# Patient Record
Sex: Female | Born: 1980 | Race: White | Hispanic: No | Marital: Single | Smoking: Never smoker
Health system: Southern US, Community
[De-identification: ages and names within clinical notes are randomized; demographics above are authoritative.]

## PROBLEM LIST (undated history)

## (undated) DIAGNOSIS — F4310 Post-traumatic stress disorder, unspecified: Secondary | ICD-10-CM

## (undated) DIAGNOSIS — M069 Rheumatoid arthritis, unspecified: Secondary | ICD-10-CM

## (undated) DIAGNOSIS — F329 Major depressive disorder, single episode, unspecified: Secondary | ICD-10-CM

## (undated) DIAGNOSIS — T148XXA Other injury of unspecified body region, initial encounter: Secondary | ICD-10-CM

## (undated) DIAGNOSIS — T8859XA Other complications of anesthesia, initial encounter: Secondary | ICD-10-CM

## (undated) DIAGNOSIS — R569 Unspecified convulsions: Secondary | ICD-10-CM

## (undated) DIAGNOSIS — K429 Umbilical hernia without obstruction or gangrene: Secondary | ICD-10-CM

## (undated) DIAGNOSIS — G629 Polyneuropathy, unspecified: Secondary | ICD-10-CM

## (undated) DIAGNOSIS — M797 Fibromyalgia: Secondary | ICD-10-CM

## (undated) DIAGNOSIS — F32A Depression, unspecified: Secondary | ICD-10-CM

## (undated) DIAGNOSIS — G4730 Sleep apnea, unspecified: Secondary | ICD-10-CM

## (undated) DIAGNOSIS — I10 Essential (primary) hypertension: Secondary | ICD-10-CM

## (undated) DIAGNOSIS — K5792 Diverticulitis of intestine, part unspecified, without perforation or abscess without bleeding: Secondary | ICD-10-CM

## (undated) HISTORY — DX: Rheumatoid arthritis, unspecified: M06.9

## (undated) HISTORY — PX: NERVE BIOPSY: SHX1015

## (undated) HISTORY — PX: FOOT TENDON SURGERY: SHX958

## (undated) HISTORY — PX: CHOLECYSTECTOMY: SHX55

## (undated) HISTORY — PX: COLON SURGERY: SHX602

## (undated) HISTORY — PX: SACRAL NERVE STIMULATOR PLACEMENT: SHX2367

## (undated) HISTORY — PX: APPENDECTOMY: SHX54

## (undated) HISTORY — PX: TONSILLECTOMY: SUR1361

---
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
## 1998-06-25 ENCOUNTER — Encounter: Payer: Self-pay | Admitting: Emergency Medicine

## 1998-06-25 ENCOUNTER — Emergency Department (HOSPITAL_COMMUNITY): Admission: EM | Admit: 1998-06-25 | Discharge: 1998-06-25 | Payer: Self-pay | Admitting: Emergency Medicine

## 1999-04-29 ENCOUNTER — Ambulatory Visit (HOSPITAL_BASED_OUTPATIENT_CLINIC_OR_DEPARTMENT_OTHER): Admission: RE | Admit: 1999-04-29 | Discharge: 1999-04-29 | Payer: Self-pay | Admitting: *Deleted

## 1999-07-14 ENCOUNTER — Encounter: Payer: Self-pay | Admitting: Emergency Medicine

## 1999-07-14 ENCOUNTER — Emergency Department (HOSPITAL_COMMUNITY): Admission: EM | Admit: 1999-07-14 | Discharge: 1999-07-14 | Payer: Self-pay | Admitting: Emergency Medicine

## 1999-09-23 ENCOUNTER — Ambulatory Visit (HOSPITAL_COMMUNITY): Admission: RE | Admit: 1999-09-23 | Discharge: 1999-09-23 | Payer: Self-pay | Admitting: Orthopedic Surgery

## 2000-04-06 ENCOUNTER — Encounter: Admission: RE | Admit: 2000-04-06 | Discharge: 2000-04-06 | Payer: Self-pay | Admitting: Family Medicine

## 2000-04-06 ENCOUNTER — Encounter: Payer: Self-pay | Admitting: Family Medicine

## 2000-07-24 ENCOUNTER — Ambulatory Visit (HOSPITAL_BASED_OUTPATIENT_CLINIC_OR_DEPARTMENT_OTHER): Admission: RE | Admit: 2000-07-24 | Discharge: 2000-07-24 | Payer: Self-pay | Admitting: Orthopedic Surgery

## 2002-01-11 ENCOUNTER — Ambulatory Visit (HOSPITAL_COMMUNITY): Admission: RE | Admit: 2002-01-11 | Discharge: 2002-01-11 | Payer: Self-pay | Admitting: General Practice

## 2002-03-31 ENCOUNTER — Encounter: Payer: Self-pay | Admitting: Emergency Medicine

## 2002-03-31 ENCOUNTER — Emergency Department (HOSPITAL_COMMUNITY): Admission: EM | Admit: 2002-03-31 | Discharge: 2002-03-31 | Payer: Self-pay | Admitting: Emergency Medicine

## 2002-05-15 ENCOUNTER — Encounter: Payer: Self-pay | Admitting: *Deleted

## 2002-05-15 ENCOUNTER — Emergency Department (HOSPITAL_COMMUNITY): Admission: EM | Admit: 2002-05-15 | Discharge: 2002-05-15 | Payer: Self-pay | Admitting: Emergency Medicine

## 2002-07-10 ENCOUNTER — Encounter: Admission: RE | Admit: 2002-07-10 | Discharge: 2002-07-10 | Payer: Self-pay | Admitting: Family Medicine

## 2002-07-10 ENCOUNTER — Encounter: Payer: Self-pay | Admitting: Family Medicine

## 2002-12-24 ENCOUNTER — Encounter: Payer: Self-pay | Admitting: General Practice

## 2002-12-24 ENCOUNTER — Encounter: Admission: RE | Admit: 2002-12-24 | Discharge: 2002-12-24 | Payer: Self-pay | Admitting: General Practice

## 2003-06-05 ENCOUNTER — Ambulatory Visit (HOSPITAL_COMMUNITY): Admission: RE | Admit: 2003-06-05 | Discharge: 2003-06-05 | Payer: Self-pay | Admitting: Internal Medicine

## 2003-06-24 ENCOUNTER — Ambulatory Visit (HOSPITAL_COMMUNITY): Admission: RE | Admit: 2003-06-24 | Discharge: 2003-06-24 | Payer: Self-pay | Admitting: Internal Medicine

## 2004-08-09 ENCOUNTER — Emergency Department (HOSPITAL_COMMUNITY): Admission: EM | Admit: 2004-08-09 | Discharge: 2004-08-09 | Payer: Self-pay | Admitting: Emergency Medicine

## 2004-09-08 ENCOUNTER — Emergency Department (HOSPITAL_COMMUNITY): Admission: EM | Admit: 2004-09-08 | Discharge: 2004-09-08 | Payer: Self-pay | Admitting: Emergency Medicine

## 2004-09-10 ENCOUNTER — Emergency Department (HOSPITAL_COMMUNITY): Admission: EM | Admit: 2004-09-10 | Discharge: 2004-09-10 | Payer: Self-pay | Admitting: Emergency Medicine

## 2005-01-26 ENCOUNTER — Emergency Department (HOSPITAL_COMMUNITY): Admission: EM | Admit: 2005-01-26 | Discharge: 2005-01-26 | Payer: Self-pay | Admitting: Family Medicine

## 2005-01-26 ENCOUNTER — Ambulatory Visit (HOSPITAL_COMMUNITY): Admission: RE | Admit: 2005-01-26 | Discharge: 2005-01-26 | Payer: Self-pay | Admitting: Family Medicine

## 2005-02-11 ENCOUNTER — Ambulatory Visit (HOSPITAL_COMMUNITY): Admission: RE | Admit: 2005-02-11 | Discharge: 2005-02-11 | Payer: Self-pay | Admitting: Obstetrics and Gynecology

## 2005-03-10 ENCOUNTER — Emergency Department (HOSPITAL_COMMUNITY): Admission: EM | Admit: 2005-03-10 | Discharge: 2005-03-11 | Payer: Self-pay | Admitting: Emergency Medicine

## 2005-03-11 ENCOUNTER — Inpatient Hospital Stay (HOSPITAL_COMMUNITY): Admission: EM | Admit: 2005-03-11 | Discharge: 2005-03-12 | Payer: Self-pay | Admitting: Emergency Medicine

## 2005-03-11 ENCOUNTER — Encounter (INDEPENDENT_AMBULATORY_CARE_PROVIDER_SITE_OTHER): Payer: Self-pay | Admitting: *Deleted

## 2006-07-06 ENCOUNTER — Encounter: Admission: RE | Admit: 2006-07-06 | Discharge: 2006-07-06 | Payer: Self-pay | Admitting: Family Medicine

## 2007-02-17 ENCOUNTER — Emergency Department (HOSPITAL_COMMUNITY): Admission: EM | Admit: 2007-02-17 | Discharge: 2007-02-17 | Payer: Self-pay | Admitting: Emergency Medicine

## 2007-04-30 ENCOUNTER — Emergency Department (HOSPITAL_COMMUNITY): Admission: EM | Admit: 2007-04-30 | Discharge: 2007-04-30 | Payer: Self-pay | Admitting: Emergency Medicine

## 2007-05-03 ENCOUNTER — Emergency Department (HOSPITAL_COMMUNITY): Admission: EM | Admit: 2007-05-03 | Discharge: 2007-05-04 | Payer: Self-pay | Admitting: Emergency Medicine

## 2007-05-06 ENCOUNTER — Emergency Department (HOSPITAL_COMMUNITY): Admission: EM | Admit: 2007-05-06 | Discharge: 2007-05-06 | Payer: Self-pay | Admitting: Emergency Medicine

## 2007-07-16 ENCOUNTER — Emergency Department (HOSPITAL_COMMUNITY): Admission: EM | Admit: 2007-07-16 | Discharge: 2007-07-16 | Payer: Self-pay | Admitting: Emergency Medicine

## 2007-09-05 ENCOUNTER — Emergency Department (HOSPITAL_COMMUNITY): Admission: EM | Admit: 2007-09-05 | Discharge: 2007-09-06 | Payer: Self-pay | Admitting: Emergency Medicine

## 2007-09-23 ENCOUNTER — Inpatient Hospital Stay (HOSPITAL_COMMUNITY): Admission: AD | Admit: 2007-09-23 | Discharge: 2007-09-23 | Payer: Self-pay | Admitting: Obstetrics and Gynecology

## 2007-11-30 ENCOUNTER — Inpatient Hospital Stay (HOSPITAL_COMMUNITY): Admission: AD | Admit: 2007-11-30 | Discharge: 2007-12-01 | Payer: Self-pay | Admitting: Obstetrics and Gynecology

## 2008-07-23 ENCOUNTER — Emergency Department (HOSPITAL_BASED_OUTPATIENT_CLINIC_OR_DEPARTMENT_OTHER): Admission: EM | Admit: 2008-07-23 | Discharge: 2008-07-23 | Payer: Self-pay | Admitting: Emergency Medicine

## 2008-09-11 ENCOUNTER — Emergency Department (HOSPITAL_BASED_OUTPATIENT_CLINIC_OR_DEPARTMENT_OTHER): Admission: EM | Admit: 2008-09-11 | Discharge: 2008-09-11 | Payer: Self-pay | Admitting: Emergency Medicine

## 2008-10-23 ENCOUNTER — Ambulatory Visit (HOSPITAL_COMMUNITY): Admission: RE | Admit: 2008-10-23 | Discharge: 2008-10-23 | Payer: Self-pay | Admitting: Obstetrics and Gynecology

## 2008-10-23 ENCOUNTER — Encounter (INDEPENDENT_AMBULATORY_CARE_PROVIDER_SITE_OTHER): Payer: Self-pay | Admitting: Obstetrics and Gynecology

## 2009-03-04 ENCOUNTER — Ambulatory Visit: Payer: Self-pay | Admitting: Diagnostic Radiology

## 2009-03-04 ENCOUNTER — Emergency Department (HOSPITAL_BASED_OUTPATIENT_CLINIC_OR_DEPARTMENT_OTHER): Admission: EM | Admit: 2009-03-04 | Discharge: 2009-03-04 | Payer: Self-pay | Admitting: Emergency Medicine

## 2009-05-11 ENCOUNTER — Inpatient Hospital Stay (HOSPITAL_COMMUNITY): Admission: AD | Admit: 2009-05-11 | Discharge: 2009-05-11 | Payer: Self-pay | Admitting: Obstetrics and Gynecology

## 2009-06-22 ENCOUNTER — Emergency Department (HOSPITAL_BASED_OUTPATIENT_CLINIC_OR_DEPARTMENT_OTHER): Admission: EM | Admit: 2009-06-22 | Discharge: 2009-06-22 | Payer: Self-pay | Admitting: Emergency Medicine

## 2009-06-22 ENCOUNTER — Ambulatory Visit: Payer: Self-pay | Admitting: Diagnostic Radiology

## 2009-07-03 ENCOUNTER — Ambulatory Visit: Payer: Self-pay | Admitting: Diagnostic Radiology

## 2009-07-03 ENCOUNTER — Emergency Department (HOSPITAL_BASED_OUTPATIENT_CLINIC_OR_DEPARTMENT_OTHER): Admission: EM | Admit: 2009-07-03 | Discharge: 2009-07-03 | Payer: Self-pay | Admitting: Emergency Medicine

## 2009-07-08 ENCOUNTER — Inpatient Hospital Stay (HOSPITAL_COMMUNITY): Admission: EM | Admit: 2009-07-08 | Discharge: 2009-07-10 | Payer: Self-pay | Admitting: Emergency Medicine

## 2009-07-26 ENCOUNTER — Emergency Department (HOSPITAL_BASED_OUTPATIENT_CLINIC_OR_DEPARTMENT_OTHER): Admission: EM | Admit: 2009-07-26 | Discharge: 2009-07-26 | Payer: Self-pay | Admitting: Emergency Medicine

## 2009-07-26 ENCOUNTER — Ambulatory Visit: Payer: Self-pay | Admitting: Diagnostic Radiology

## 2009-10-08 ENCOUNTER — Emergency Department (HOSPITAL_BASED_OUTPATIENT_CLINIC_OR_DEPARTMENT_OTHER): Admission: EM | Admit: 2009-10-08 | Discharge: 2009-10-08 | Payer: Self-pay | Admitting: Emergency Medicine

## 2009-11-17 ENCOUNTER — Emergency Department (HOSPITAL_BASED_OUTPATIENT_CLINIC_OR_DEPARTMENT_OTHER): Admission: EM | Admit: 2009-11-17 | Discharge: 2009-11-17 | Payer: Self-pay | Admitting: Emergency Medicine

## 2009-11-19 ENCOUNTER — Ambulatory Visit: Payer: Self-pay | Admitting: Interventional Radiology

## 2009-11-19 ENCOUNTER — Emergency Department (HOSPITAL_BASED_OUTPATIENT_CLINIC_OR_DEPARTMENT_OTHER): Admission: EM | Admit: 2009-11-19 | Discharge: 2009-11-20 | Payer: Self-pay | Admitting: Emergency Medicine

## 2009-11-23 ENCOUNTER — Emergency Department (HOSPITAL_BASED_OUTPATIENT_CLINIC_OR_DEPARTMENT_OTHER): Admission: EM | Admit: 2009-11-23 | Discharge: 2009-11-23 | Payer: Self-pay | Admitting: Emergency Medicine

## 2009-12-03 ENCOUNTER — Emergency Department (HOSPITAL_BASED_OUTPATIENT_CLINIC_OR_DEPARTMENT_OTHER): Admission: EM | Admit: 2009-12-03 | Discharge: 2009-12-03 | Payer: Self-pay | Admitting: Emergency Medicine

## 2009-12-06 ENCOUNTER — Emergency Department (HOSPITAL_BASED_OUTPATIENT_CLINIC_OR_DEPARTMENT_OTHER): Admission: EM | Admit: 2009-12-06 | Discharge: 2009-12-06 | Payer: Self-pay | Admitting: Emergency Medicine

## 2009-12-06 ENCOUNTER — Ambulatory Visit: Payer: Self-pay | Admitting: Diagnostic Radiology

## 2009-12-17 ENCOUNTER — Ambulatory Visit: Payer: Self-pay | Admitting: Obstetrics and Gynecology

## 2009-12-17 ENCOUNTER — Inpatient Hospital Stay (HOSPITAL_COMMUNITY): Admission: AD | Admit: 2009-12-17 | Discharge: 2009-12-17 | Payer: Self-pay | Admitting: Obstetrics and Gynecology

## 2010-01-12 ENCOUNTER — Emergency Department (HOSPITAL_BASED_OUTPATIENT_CLINIC_OR_DEPARTMENT_OTHER): Admission: EM | Admit: 2010-01-12 | Discharge: 2010-01-12 | Payer: Self-pay | Admitting: Emergency Medicine

## 2010-03-13 ENCOUNTER — Emergency Department (HOSPITAL_BASED_OUTPATIENT_CLINIC_OR_DEPARTMENT_OTHER): Admission: EM | Admit: 2010-03-13 | Discharge: 2010-03-13 | Payer: Self-pay | Admitting: Emergency Medicine

## 2010-03-13 ENCOUNTER — Ambulatory Visit: Payer: Self-pay | Admitting: Diagnostic Radiology

## 2010-03-25 IMAGING — CR DG CHEST 2V
2 series · 2 of 2 positions shown · non-contrast
Comparison: 07/16/2007

CLINICAL DATA: Shortness of breath

CHEST - 2 VIEW

[w chest pa]
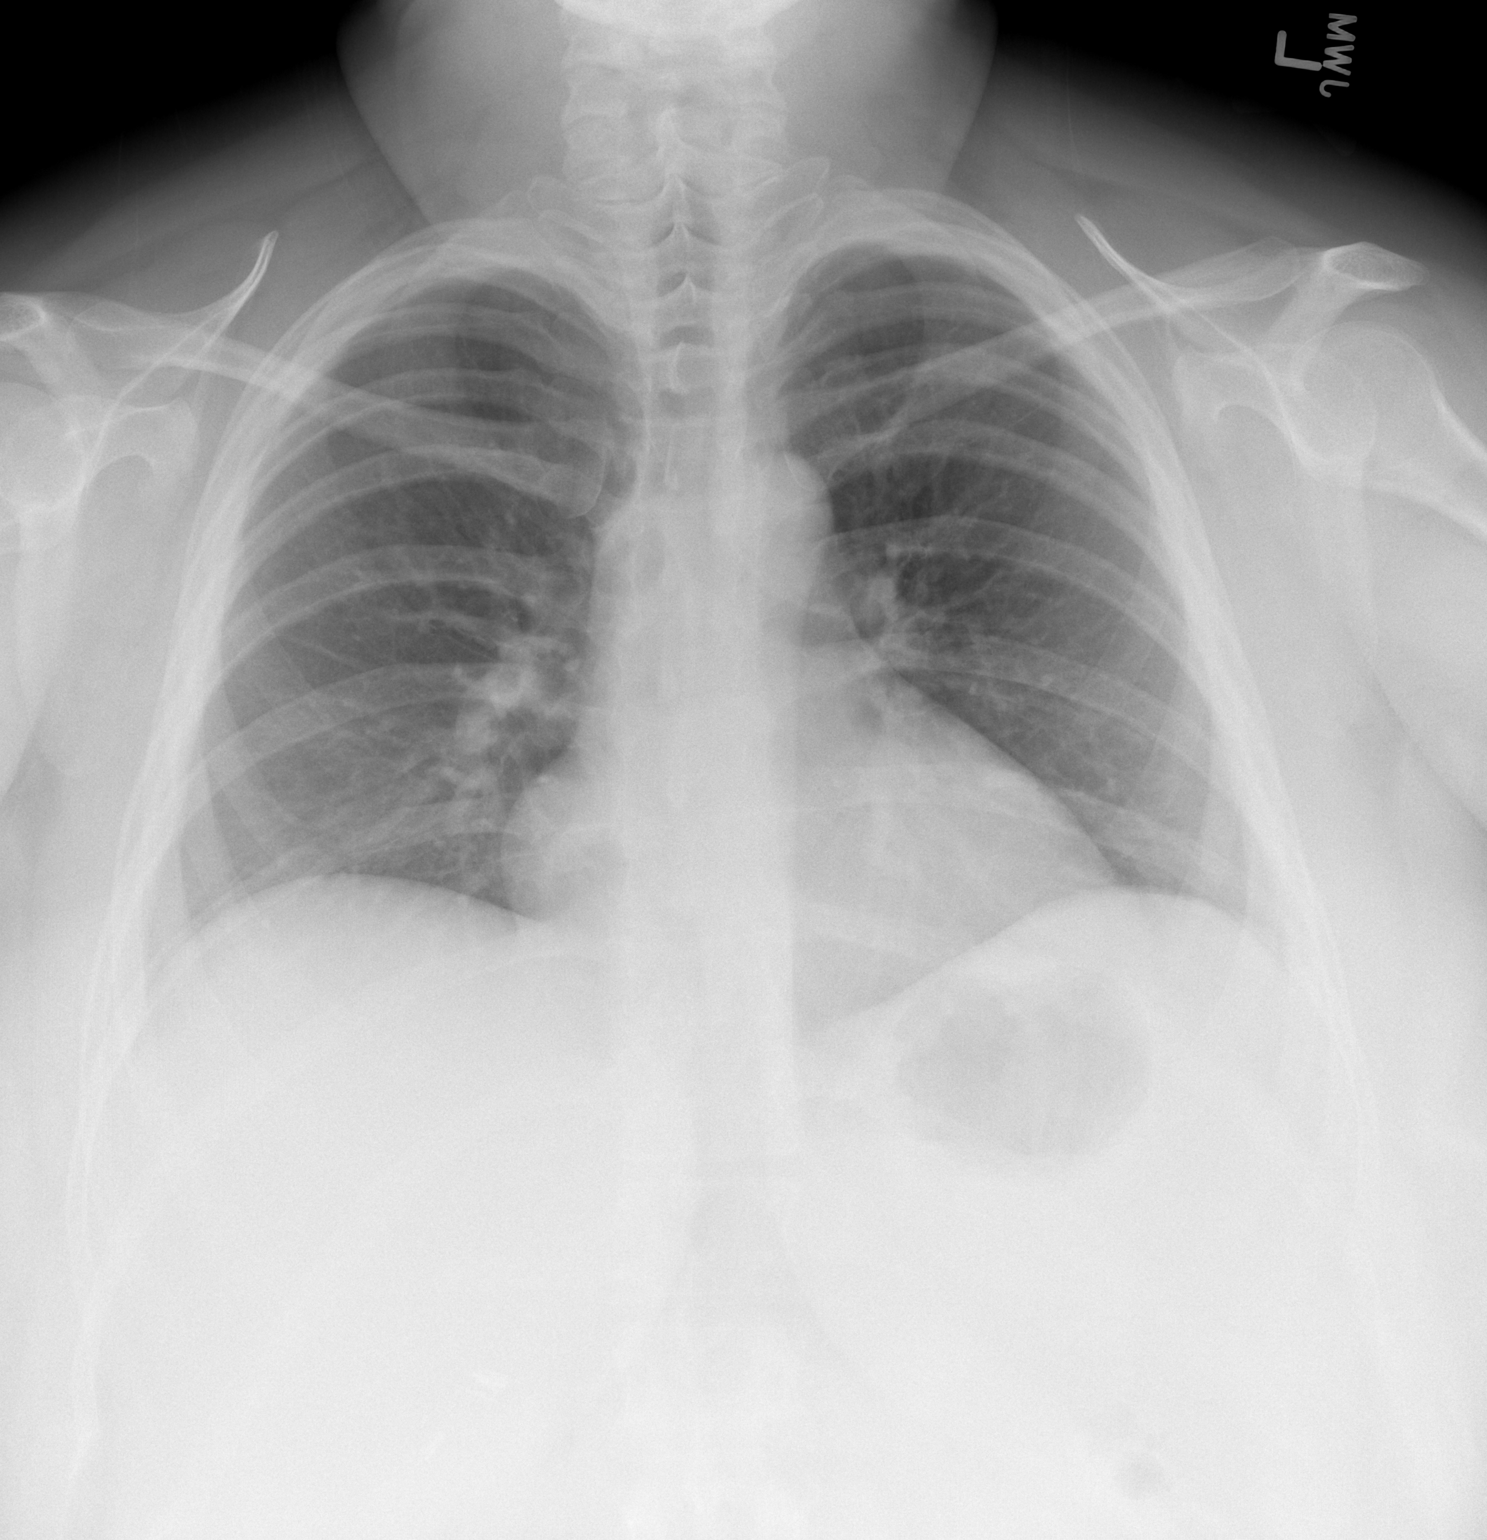

[w chest lat]
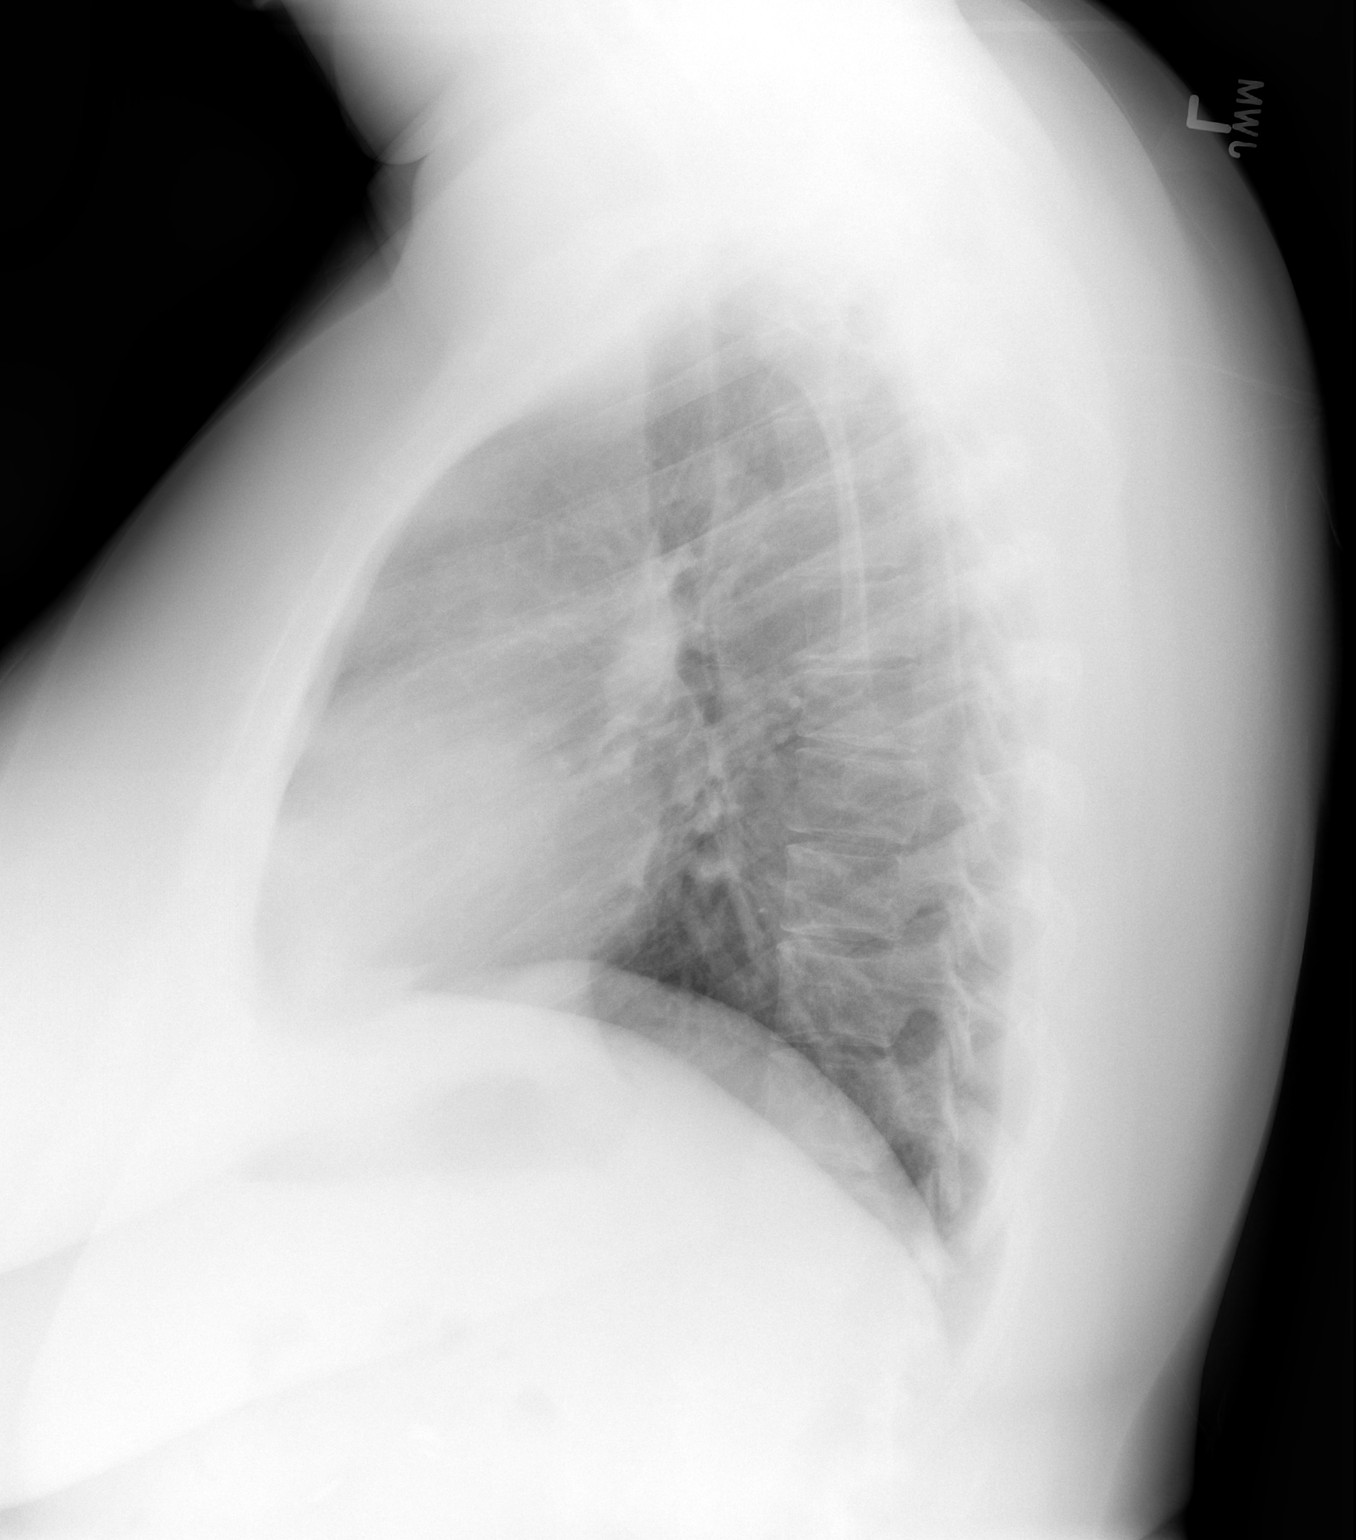

[2 of 2 positions shown; findings below may reference images not displayed]

FINDINGS: Cardiomediastinal silhouette is stable.  No acute
infiltrate or edema.  Minimal decreased height of  probable T7
vertebral body is stable from prior exam.  No pleural effusion.
IMPRESSION: No active disease.  No significant change.

## 2010-05-08 ENCOUNTER — Encounter: Payer: Self-pay | Admitting: Internal Medicine

## 2010-06-29 LAB — PREGNANCY, URINE: Preg Test, Ur: NEGATIVE

## 2010-07-01 LAB — COMPREHENSIVE METABOLIC PANEL WITH GFR
ALT: 28 U/L (ref 0–35)
AST: 33 U/L (ref 0–37)
Albumin: 3.8 g/dL (ref 3.5–5.2)
Alkaline Phosphatase: 105 U/L (ref 39–117)
BUN: 12 mg/dL (ref 6–23)
CO2: 24 meq/L (ref 19–32)
Calcium: 9.2 mg/dL (ref 8.4–10.5)
Chloride: 109 meq/L (ref 96–112)
Creatinine, Ser: 0.8 mg/dL (ref 0.4–1.2)
GFR calc non Af Amer: >60 mL/min
Glucose, Bld: 89 mg/dL (ref 70–99)
Potassium: 4.5 meq/L (ref 3.5–5.1)
Sodium: 143 meq/L (ref 135–145)
Total Bilirubin: 0.6 mg/dL (ref 0.3–1.2)
Total Protein: 7.2 g/dL (ref 6.0–8.3)

## 2010-07-01 LAB — CBC
HCT: 42.8 % (ref 36.0–46.0)
HCT: 40.1 % (ref 36.0–46.0)
Hemoglobin: 13.6 g/dL (ref 12.0–15.0)
MCH: 29.7 pg (ref 26.0–34.0)
MCV: 89.7 fL (ref 78.0–100.0)
MCV: 90.8 fL (ref 78.0–100.0)
RBC: 4.77 MIL/uL (ref 3.87–5.11)
RDW: 12.4 % (ref 11.5–15.5)
RDW: 13.2 % (ref 11.5–15.5)
WBC: 8.9 10*3/uL (ref 4.0–10.5)
WBC: 9.2 10*3/uL (ref 4.0–10.5)

## 2010-07-01 LAB — DIFFERENTIAL
Lymphocytes Relative: 32 % (ref 12–46)
Lymphs Abs: 2.9 10*3/uL (ref 0.7–4.0)
Monocytes Absolute: 0.6 10*3/uL (ref 0.1–1.0)
Monocytes Relative: 7 % (ref 3–12)
Neutro Abs: 5.2 10*3/uL (ref 1.7–7.7)

## 2010-07-01 LAB — URINALYSIS, ROUTINE W REFLEX MICROSCOPIC
Bilirubin Urine: NEGATIVE
Hgb urine dipstick: NEGATIVE
Nitrite: POSITIVE — AB
Protein, ur: NEGATIVE mg/dL
Specific Gravity, Urine: 1.01 (ref 1.005–1.030)
Specific Gravity, Urine: 1.01 (ref 1.005–1.030)
Urobilinogen, UA: 0.2 mg/dL (ref 0.0–1.0)
Urobilinogen, UA: 0.2 mg/dL (ref 0.0–1.0)

## 2010-07-01 LAB — WET PREP, GENITAL
Clue Cells Wet Prep HPF POC: NONE SEEN
Trich, Wet Prep: NONE SEEN
Yeast Wet Prep HPF POC: NONE SEEN

## 2010-07-01 LAB — URINE MICROSCOPIC-ADD ON

## 2010-07-01 LAB — PROTIME-INR
INR: 1 (ref 0.00–1.49)
Prothrombin Time: 13.4 s (ref 11.6–15.2)

## 2010-07-01 LAB — HEMOCCULT GUIAC POC 1CARD (OFFICE): Fecal Occult Bld: POSITIVE

## 2010-07-01 LAB — PREGNANCY, URINE: Preg Test, Ur: NEGATIVE

## 2010-07-01 LAB — GC/CHLAMYDIA PROBE AMP, GENITAL
Chlamydia, DNA Probe: POSITIVE — AB
GC Probe Amp, Genital: NEGATIVE

## 2010-07-02 LAB — BASIC METABOLIC PANEL
CO2: 27 mEq/L (ref 19–32)
Chloride: 109 mEq/L (ref 96–112)
Creatinine, Ser: 0.9 mg/dL (ref 0.4–1.2)
GFR calc Af Amer: >60 mL/min (ref >60)
Potassium: 3.8 mEq/L (ref 3.5–5.1)

## 2010-07-02 LAB — CBC
HCT: 40.2 % (ref 36.0–46.0)
Hemoglobin: 13.6 g/dL (ref 12.0–15.0)
MCH: 30.4 pg (ref 26.0–34.0)
MCV: 89.9 fL (ref 78.0–100.0)
Platelets: 263 10*3/uL (ref 150–400)
RBC: 4.47 MIL/uL (ref 3.87–5.11)

## 2010-07-02 LAB — CSF CULTURE W GRAM STAIN: Culture: NO GROWTH

## 2010-07-02 LAB — PROTEIN AND GLUCOSE, CSF
Glucose, CSF: 49 mg/dL (ref 43–76)
Total  Protein, CSF: 30 mg/dL (ref 15–45)

## 2010-07-02 LAB — CSF CELL COUNT WITH DIFFERENTIAL
Tube #: 1
Tube #: 4

## 2010-07-02 LAB — GRAM STAIN

## 2010-07-04 LAB — URINALYSIS, ROUTINE W REFLEX MICROSCOPIC
Glucose, UA: NEGATIVE mg/dL
Glucose, UA: NEGATIVE mg/dL
Hgb urine dipstick: NEGATIVE
Leukocytes, UA: NEGATIVE
Protein, ur: NEGATIVE mg/dL
Protein, ur: NEGATIVE mg/dL
Specific Gravity, Urine: 1.004 — ABNORMAL LOW (ref 1.005–1.030)

## 2010-07-04 LAB — URINE MICROSCOPIC-ADD ON

## 2010-07-07 LAB — BASIC METABOLIC PANEL WITH GFR
BUN: 8 mg/dL (ref 6–23)
CO2: 26 meq/L (ref 19–32)
Calcium: 8.9 mg/dL (ref 8.4–10.5)
Chloride: 110 meq/L (ref 96–112)
Creatinine, Ser: 0.8 mg/dL (ref 0.4–1.2)
GFR calc non Af Amer: >60 mL/min
Glucose, Bld: 75 mg/dL (ref 70–99)
Potassium: 3.9 meq/L (ref 3.5–5.1)
Sodium: 143 meq/L (ref 135–145)

## 2010-07-07 LAB — DIFFERENTIAL
Basophils Absolute: 0.3 K/uL — ABNORMAL HIGH (ref 0.0–0.1)
Basophils Relative: 3 % — ABNORMAL HIGH (ref 0–1)
Eosinophils Absolute: 0.2 K/uL (ref 0.0–0.7)
Eosinophils Relative: 2 % (ref 0–5)
Lymphocytes Relative: 29 % (ref 12–46)
Lymphs Abs: 2.9 K/uL (ref 0.7–4.0)
Monocytes Absolute: 0.7 K/uL (ref 0.1–1.0)
Monocytes Relative: 7 % (ref 3–12)
Neutro Abs: 6.2 K/uL (ref 1.7–7.7)
Neutrophils Relative %: 61 % (ref 43–77)

## 2010-07-07 LAB — URINALYSIS, ROUTINE W REFLEX MICROSCOPIC
Nitrite: NEGATIVE
Specific Gravity, Urine: 1.012 (ref 1.005–1.030)
pH: 5.5 (ref 5.0–8.0)

## 2010-07-07 LAB — CBC
HCT: 40.5 % (ref 36.0–46.0)
Hemoglobin: 13.5 g/dL (ref 12.0–15.0)
MCHC: 33.3 g/dL (ref 30.0–36.0)
MCV: 89.7 fL (ref 78.0–100.0)
Platelets: 272 K/uL (ref 150–400)
RBC: 4.51 MIL/uL (ref 3.87–5.11)
RDW: 12.9 % (ref 11.5–15.5)
WBC: 10.3 K/uL (ref 4.0–10.5)

## 2010-07-07 LAB — PREGNANCY, URINE: Preg Test, Ur: NEGATIVE

## 2010-07-09 LAB — BASIC METABOLIC PANEL
BUN: 9 mg/dL (ref 6–23)
Creatinine, Ser: 0.78 mg/dL (ref 0.4–1.2)
GFR calc non Af Amer: >60 mL/min (ref >60)
Glucose, Bld: 80 mg/dL (ref 70–99)

## 2010-07-09 LAB — POCT I-STAT, CHEM 8
BUN: 9 mg/dL (ref 6–23)
Chloride: 109 mEq/L (ref 96–112)
Potassium: 4 mEq/L (ref 3.5–5.1)
Sodium: 141 mEq/L (ref 135–145)

## 2010-07-09 LAB — MAGNESIUM: Magnesium: 2.1 mg/dL (ref 1.5–2.5)

## 2010-07-09 LAB — URINALYSIS, ROUTINE W REFLEX MICROSCOPIC
Glucose, UA: NEGATIVE mg/dL
Hgb urine dipstick: NEGATIVE
Protein, ur: NEGATIVE mg/dL
pH: 6 (ref 5.0–8.0)

## 2010-07-09 LAB — CBC
Hemoglobin: 14.5 g/dL (ref 12.0–15.0)
MCHC: 33.3 g/dL (ref 30.0–36.0)
MCV: 90.2 fL (ref 78.0–100.0)
RBC: 4.84 MIL/uL (ref 3.87–5.11)

## 2010-07-09 LAB — POCT CARDIAC MARKERS
CKMB, poc: <1 ng/mL — ABNORMAL LOW (ref 1.0–8.0)
Myoglobin, poc: 50.6 ng/mL (ref 12–200)
Troponin i, poc: <0.05 ng/mL (ref 0.00–0.09)

## 2010-07-09 LAB — TRICYCLICS SCREEN, URINE: TCA Scrn: NOT DETECTED

## 2010-07-09 LAB — POCT PREGNANCY, URINE: Preg Test, Ur: NEGATIVE

## 2010-07-09 LAB — DIFFERENTIAL
Basophils Relative: 0 % (ref 0–1)
Monocytes Absolute: 0.6 10*3/uL (ref 0.1–1.0)
Monocytes Relative: 5 % (ref 3–12)
Neutro Abs: 9.1 10*3/uL — ABNORMAL HIGH (ref 1.7–7.7)

## 2010-07-21 LAB — DIFFERENTIAL
Eosinophils Absolute: 0.2 10*3/uL (ref 0.0–0.7)
Eosinophils Relative: 2 % (ref 0–5)
Lymphocytes Relative: 26 % (ref 12–46)
Lymphs Abs: 2.1 10*3/uL (ref 0.7–4.0)
Monocytes Absolute: 0.6 10*3/uL (ref 0.1–1.0)

## 2010-07-21 LAB — HEMOCCULT GUIAC POC 1CARD (OFFICE): Fecal Occult Bld: POSITIVE

## 2010-07-21 LAB — URINALYSIS, ROUTINE W REFLEX MICROSCOPIC
Bilirubin Urine: NEGATIVE
Glucose, UA: NEGATIVE mg/dL
Ketones, ur: NEGATIVE mg/dL
Specific Gravity, Urine: 1.004 — ABNORMAL LOW (ref 1.005–1.030)
pH: 6 (ref 5.0–8.0)

## 2010-07-21 LAB — WET PREP, GENITAL
Trich, Wet Prep: NONE SEEN
WBC, Wet Prep HPF POC: NONE SEEN
Yeast Wet Prep HPF POC: NONE SEEN

## 2010-07-21 LAB — CBC
HCT: 40.4 % (ref 36.0–46.0)
Hemoglobin: 13.7 g/dL (ref 12.0–15.0)
MCV: 89.3 fL (ref 78.0–100.0)
Platelets: 240 10*3/uL (ref 150–400)
WBC: 8.1 10*3/uL (ref 4.0–10.5)

## 2010-07-21 LAB — BASIC METABOLIC PANEL
BUN: 8 mg/dL (ref 6–23)
Chloride: 109 mEq/L (ref 96–112)
GFR calc non Af Amer: >60 mL/min (ref >60)
Glucose, Bld: 88 mg/dL (ref 70–99)
Potassium: 4.2 mEq/L (ref 3.5–5.1)
Sodium: 146 mEq/L — ABNORMAL HIGH (ref 135–145)

## 2010-07-21 LAB — RPR: RPR Ser Ql: NONREACTIVE

## 2010-07-21 LAB — LIPASE, BLOOD: Lipase: 160 U/L (ref 23–300)

## 2010-07-25 LAB — CBC
MCHC: 34.7 g/dL (ref 30.0–36.0)
Platelets: 305 10*3/uL (ref 150–400)
RDW: 13.4 % (ref 11.5–15.5)

## 2010-07-25 LAB — PROTIME-INR
INR: 1 (ref 0.00–1.49)
Prothrombin Time: 13.5 seconds (ref 11.6–15.2)

## 2010-07-25 LAB — APTT: aPTT: 31 seconds (ref 24–37)

## 2010-07-27 LAB — BASIC METABOLIC PANEL
CO2: 28 mEq/L (ref 19–32)
Chloride: 107 mEq/L (ref 96–112)
GFR calc Af Amer: >60 mL/min (ref >60)
Potassium: 3.9 mEq/L (ref 3.5–5.1)
Sodium: 144 mEq/L (ref 135–145)

## 2010-07-27 LAB — GC/CHLAMYDIA PROBE AMP, GENITAL: GC Probe Amp, Genital: NEGATIVE

## 2010-07-27 LAB — URINALYSIS, ROUTINE W REFLEX MICROSCOPIC
Glucose, UA: NEGATIVE mg/dL
Protein, ur: NEGATIVE mg/dL

## 2010-07-27 LAB — URINE MICROSCOPIC-ADD ON

## 2010-07-27 LAB — DIFFERENTIAL
Basophils Relative: 0 % (ref 0–1)
Eosinophils Absolute: 0.3 10*3/uL (ref 0.0–0.7)
Lymphs Abs: 2.6 10*3/uL (ref 0.7–4.0)
Monocytes Absolute: 0.5 10*3/uL (ref 0.1–1.0)
Monocytes Relative: 6 % (ref 3–12)

## 2010-07-27 LAB — CBC
Hemoglobin: 13.5 g/dL (ref 12.0–15.0)
MCHC: 33.6 g/dL (ref 30.0–36.0)
MCV: 87.3 fL (ref 78.0–100.0)
RBC: 4.61 MIL/uL (ref 3.87–5.11)

## 2010-08-21 ENCOUNTER — Other Ambulatory Visit: Payer: Self-pay | Admitting: Obstetrics and Gynecology

## 2010-08-21 ENCOUNTER — Ambulatory Visit (HOSPITAL_COMMUNITY)
Admission: AD | Admit: 2010-08-21 | Discharge: 2010-08-21 | Disposition: A | Discharge status: Home / Self Care | Payer: Self-pay | Source: Ambulatory Visit | Attending: Obstetrics and Gynecology | Admitting: Obstetrics and Gynecology

## 2010-08-21 DIAGNOSIS — N920 Excessive and frequent menstruation with regular cycle: Secondary | ICD-10-CM | POA: Insufficient documentation

## 2010-08-21 LAB — CBC
HCT: 37.6 % (ref 36.0–46.0)
MCH: 28.7 pg (ref 26.0–34.0)
MCV: 87.6 fL (ref 78.0–100.0)
Platelets: 279 10*3/uL (ref 150–400)
RBC: 4.29 MIL/uL (ref 3.87–5.11)
RDW: 13.1 % (ref 11.5–15.5)

## 2010-08-23 NOTE — Op Note (Signed)
[name], Foster               ACCOUNT NO.:  [id number]  MEDICAL RECORD NO.:  [id number]           PATIENT TYPE:  O  LOCATION:  [facility]                          [facility]ACILITY[facility]  PHYSICIAN:  Miguel Aschoff, M.D.       DATE OF BIRTH:  02-05-1981  DATE OF PROCEDURE:  08/21/2010 DATE OF DISCHARGE:                              OPERATIVE REPORT   PREOPERATIVE DIAGNOSIS:  Menometrorrhagia.  POSTOPERATIVE DIAGNOSIS:  Menometrorrhagia.  PROCEDURE:  D and C.  SURGEON:  Miguel Aschoff, MD  ANESTHESIA:  IV sedation with paracervical block.  COMPLICATIONS:  None.  JUSTIFICATION:  The patient is a 30 year old white female whose has had 2 weeks of heavy vaginal bleeding.  The patient was seen early in the week and treated with Lysteda 650 mg two p.o. t.i.d. in an effort to control her bleeding as well as oral contraceptives.  However, she called in the morning of Aug 21, 2010, reporting that there was no improvement in the bleeding and her bleeding was getting heavier and she was feeling weaker because of it.  Because of the unresponsive bleeding to medical therapy, she presents now to undergo D and C in an effort to stop her bleeding and assess an etiology.  Risks and benefits of the procedure were discussed with the patient.  PROCEDURE:  The patient was taken to the operating room and was placed in the supine position.  An IV sedation was administered without difficulty.  She was then placed in dorsal lithotomy position, prepped and draped in the usual sterile fashion.  The bladder was catheterized. Examination under anesthesia revealed normal external genitalia, normal Bartholin and Skene glands, normal urethra.  The vaginal vault was without gross lesion.  There were clots noted within the vaginal vault as well as blood in the perineum.  Cervix did not have a gross lesions. Blood was coming through the cervical os along with small clots.  The uterus appeared to be top normal size.  No  adnexal masses were noted. At this point, a speculum was placed in the vaginal vault.  Anterior cervical lip was grasped with a tenaculum and then injected with 15 mL of 1% Xylocaine by placing equal amounts at 12 4, and 8 o'clock positions of the cervix.  Once this was done, the cervix was further dilated using serial Pratt dilators until [employer] dilator could be passed.  Once this was done, a medium-size sharp curette was used to completely curette the uterine cavity.  A moderate amount of normal- appearing endometrial tissue was obtained.  Inspection for polyps or submucous myomas was negative.  Once the cavity was felt to be completely curetted, no additional tissue was obtained, and hemostasis was normal.  The procedure was completed and all instruments were removed.  The patient was reversed from the anesthetic and taken to recovery room in satisfactory condition.  Plan is for the patient to be discharged home.  Medications for home include doxycycline 100 mg twice a day x3 days, Lysteda 650 mg t.i.d. as needed for heavy bleeding, Aleve 2 p.o. b.i.d. as needed for pain.  The patient is  to call for pathology report on June 25, 2010.  She will be seen back in 4 weeks for followup examination and she is to call for any problems such as fever, pain, or heavy bleeding.     Miguel Aschoff, M.D.     AR/MEDQ  D:  08/21/2010  T:  08/22/2010  Job:  [id number]  Electronically Signed by Miguel Aschoff M.D. on 08/23/2010 06:00:22 PM

## 2010-08-31 NOTE — Op Note (Signed)
[name], [name]               ACCOUNT NO.:  [id number]   MEDICAL RECORD NO.:  [id number]          PATIENT TYPE:  AMB   LOCATION:  [facility]                           [facility]   PHYSICIAN:  Miguel Aschoff, M.D.       DATE OF BIRTH:  24-Apr-1980   DATE OF PROCEDURE:  10/23/2008  DATE OF DISCHARGE:  10/23/2008                               OPERATIVE REPORT   PREOPERATIVE DIAGNOSIS:  Menometrorrhagia.   POSTOPERATIVE DIAGNOSIS:  Menometrorrhagia.   PROCEDURE:  Cervical dilatation, hysteroscopy, and uterine curettage.   SURGEON:  Miguel Aschoff, MD   ANESTHESIA:  General.   COMPLICATIONS:  None.   JUSTIFICATION:  The patient is a 30 year old white female who  approximately 6 months postpartum, has been on oral contraceptives, but  has continued to have irregular vaginal bleeding that has not responded  to conservative therapy and adjustment of her oral contraceptives.  Because of continued bleeding, she presents now to undergo hysteroscopy  and D and C to see the anatomic etiology for the bleeding, can be  established and corrected.  The risks and benefits of the procedure were  discussed with the patient.   DESCRIPTION OF PROCEDURE:  The patient was taken to the operating room  and placed in supine position.  General anesthesia was administered  without difficulty.  She was then placed in the dorsal lithotomy  position, prepped and draped in the usual sterile fashion.  Bladder was  catheterized.  At this point, examination under anesthesia revealed  normal external genitalia, normal Bartholin and Skene glands, normal  urethra.  The vaginal vault was without gross lesion.  The cervix was  without gross lesion.  Uterus noted to be anterior, normal size and  shape.  No adnexal masses were noted.  At this point, a speculum was  placed in the vaginal vault.  Anterior cervical lip was grasped with a  tenaculum and then dilated using serial Pratt dilators until a #25 Pratt  dilator could  be passed without difficulty.  Once this was done, the  diagnostic hysteroscope was advanced through the endocervical canal into  uterine cavity.  The remarkable finding on the hysteroscopy was a large  volume of tissue within the uterus with multiple irregularities of the  entire surface of the endometrial cavity.  No definite polyps or  submucous myomas were noted, just a large volume of irregular tissue  arising from the surface of the endometrium.  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  No additional subcavity was identified on  the hysteroscopy.  Once hysteroscopy was completed, vigorous sharp  curettage was carried out using large volume of endometrial curettings,  which were sent for histologic study.  After this was done, the scope  was reintroduced to try to ensure, this irregular surface had been  totally resected without difficulty, and it appeared to be the case.  At  this point, the instruments were removed.  The cervix was injected with  10 mL of 1% Xylocaine for analgesia.  The patient was reversed from  anesthetic and taken to recovery room  in satisfactory condition.   Plan is for the patient to be discharged home.  Medications for home  include Darvocet-N 100 every 4 hours need for pain.  She is to continue  all other medications.  She will be seen back in 4 weeks for followup  examination.  She is to call the office on October 28, 2008, for her  pathology report at which time any adjustments this therapy will be  made.      Miguel Aschoff, M.D.  Electronically Signed     AR/MEDQ  D:  10/23/2008  T:  10/24/2008  Job:  [id number]

## 2010-09-03 NOTE — H&P (Signed)
NAMESEYMONE, [name]               ACCOUNT NO.:  [id number]   MEDICAL RECORD NO.:  [id number]          PATIENT TYPE:  EMS   LOCATION:  ED                           FACILITY:  [facility]   PHYSICIAN:  Angelia Mould. Derrell Lolling, M.D.DATE OF BIRTH:  20-Dec-1980   DATE OF ADMISSION:  03/11/2005  DATE OF DISCHARGE:                                HISTORY & PHYSICAL   CHIEF COMPLAINT:  Right upper quadrant pain, nausea and vomiting.   HISTORY OF PRESENT ILLNESS:  This is a 30 year old white female who noted  the onset of post prandial right upper quadrant pain, right flank pain,  right back pain, nausea and vomiting yesterday after lunch.  The pain got  worse.  She came to the [facility] Emergency Room at 7 p.m. last  night.  She had lab work which was basically normal except for a white blood  cell count of 14,000 and an ultrasound showed numerous gallstones but was  otherwise unimpressive, except for tenderness over the gallbladder.  She was  given pain medicine and discharged home with followup with Dr. Darnell Level.  The pain, the nausea, and the vomiting have persisted.  She has vomited  several times today.  She called me and I advised her to come back to the  emergency room for re-evaluation.   She has not had any prior similar episodes.  She denies any history of liver  disease, cardiac disease, other gastrointestinal problems, or pulmonary  problems.   PAST HISTORY:  1.  Diagnostic laparoscopy four to six weeks ago by Dr. Sylvester Harder.      This was to rule out endometriosis and she states that nothing abnormal      was found.  2.  She has had a tonsillectomy.  3.  She has had a right wrist fracture which required reduction under      anesthesia.  4.  She has had right knee arthroscopy, three times.  This was done by Dr.      Simonne Come.  5.  She has attention-deficit-disorder.  6.  She has obesity.   CURRENT MEDICATIONS:  1.  Strattera 40 mg daily.  2.  Wellbutrin 150 mg  daily.  3.  Birth control pills.   DRUG ALLERGIES:  1.  DILAUDID causes pruritus.  2.  ULTRACET causes a skin rash.   FAMILY HISTORY:  Mother living has thyroid problems, otherwise well.  Father  living and well.  Two sisters living and well.  She states there are  gallbladder problems on the father's side.   SOCIAL HISTORY:  She is single, never been pregnant, lives with her mother,  works for [employer] as a Engineer, site.  Denies the use of tobacco,  drinks alcohol rarely.   REVIEW OF SYSTEMS:  A 15-system review of systems is performed.  It is  noncontributory except as described above.   PHYSICAL EXAMINATION:  GENERAL:  A pleasant, obese, young, white female in  minimal distress.  VITAL SIGNS:  Stated height 5 foot 6 inches, stated weight 230 pounds, temp  99.2, pulse 99, respirations 16, blood  pressure 119/77.  EYES:  Sclerae clear.  Extraocular movements intact.  EARS/NOSE/MOUTH/THROAT:  Nose, lips, tongue, and oropharynx are without  gross lesions.  NECK:  Supple.  Nontender.  No mass.  No jugular venous distention.  LUNGS:  Clear to auscultation.  No chest wall tenderness.  HEART:  Regular rate and rhythm.  No murmur.  Radial and femoral and  posterior tibial pulses are palpable.  BREASTS:  Not examined.  ABDOMEN:  Obese, soft.  She is tender in the right upper quadrant but there  really is no guarding or rebound.  There is no mass.  The liver and spleen  do not appear enlarged.  She has some laparoscopy scars just below the  umbilicus and in the suprapubic area which look fairly recent but are  healing uneventfully.  The rest of the abdomen is soft and nontender.  EXTREMITIES:  She moves all four extremities well without pain or deformity.  NEUROLOGIC:  No gross motor or sensory deficits.   DATA ORDER REVIEW:  Hemoglobin 11,900, white blood cell count 14,400.  Lipase 37.  Liver function tests are normal.  Urinalysis normal.  Urine  pregnancy normal.  Ultrasound  shows gallstones but no other gross  abnormality.   ASSESSMENT:  1.  Acute cholecystitis with cholelithiasis.  2.  Attention-deficit-disorder.  3.  Obesity.  4.  Status post recent pelvic laparoscopy.   PLAN:  The patient will be admitted, started on intravenous antibiotics, and  taken to the operating room this afternoon for a laparoscopic  cholecystectomy.   I have discussed the indications and details of the surgery with her.  Risks  and complications have been outlined, including but not limited to bleeding,  infection, conversion to open laparotomy, injury to adjacent organs such as  common bile duct or intestine with major reconstructive surgery, wound  problems such as infection or hernia, cardiac pulmonary and thromboembolic  problems.  She seems to understand these issues well.  At this time all of  her questions are answered.  She would like to go ahead with this plan.  The  entire encounter was done with the mother present.   We will schedule her surgery this afternoon.      Angelia Mould. Derrell Lolling, M.D.  Electronically Signed     HMI/MEDQ  D:  03/11/2005  T:  03/11/2005  Job:  [id number]   cc:   Tammy R. [place], M.D.  Fax: [phone]   Rudy Jew. Ashley Royalty, M.D.  Fax: [phone]

## 2010-09-03 NOTE — Op Note (Signed)
[name], HOLLOPETER               ACCOUNT NO.:  [id number]   MEDICAL RECORD NO.:  [id number]          PATIENT TYPE:  INP   LOCATION:  0098                         FACILITY:  [facility]   PHYSICIAN:  Angelia Mould. Derrell Lolling, M.D.DATE OF BIRTH:  [birth year]   DATE OF PROCEDURE:  03/11/2005  DATE OF DISCHARGE:                                 OPERATIVE REPORT   PREOPERATIVE DIAGNOSIS:  Acute cholecystitis with cholelithiasis.   POSTOPERATIVE DIAGNOSIS:  Acute cholecystitis with cholelithiasis.   OPERATION PERFORMED:  Laparoscopic cholecystectomy.   SURGEON:  Angelia Mould. Derrell Lolling, M.D.   FIRST ASSISTANT:  Leonie Man, M.D.   OPERATIVE INDICATIONS:  This is a 30 year old morbidly obese white female  who developed post prandial right upper quadrant pain and right flank pain  with nausea and vomiting yesterday.  She came to the [facility] emergency  room last night and was evaluated.  Her ultrasound shows multiple  gallstones, but the gallbladder did not appear to be that thickened.  White  blood cell count was 14,000.  Liver function tests and lipase were normal.  She was sent home.  She continued to have pain, nausea and vomiting.  Called  me today.  I asked her to come back to the hospital.  She has right upper  quadrant tenderness on exam but no guarding, mass, or rebound.  She was  offered cholecystectomy at this time, which she wanted to have.  She is  brought to the operating room urgently.   OPERATIVE FINDINGS:  The gallbladder was somewhat thick-walled and  edematous.  There was no gangrene or exudate.  The cystic duct was somewhat  large . We could not obtain a cholangiogram because of back flow from the  catheter.  We tried the [facility] catheter three different times with multiple  clips, and it would not flow through.  We looked carefully for stones and  did not see any in the distal cystic duct.  We tried a Reddick catheter, but  that also did not work.  Because the liver function tests  were normal, we  abandoned the cholangiography.  The liver, stomach, duodenum, small  intestine, and large intestine were grossly normal to inspection.   OPERATIVE TECHNIQUE:  Following the induction of general endotracheal  anesthesia, the patient's abdomen was prepped and draped in a sterile  fashion.  Marcaine 0.5% with epinephrine was used as a local infiltration  anesthetic.  Intravenous antibiotics were given preoperatively.  A  vertically oriented incision was made at the upper rim of the umbilicus.  The fascia was incised in the midline, and the abdominal cavity entered  under direct vision.  A 10 mm Hasson trocar was inserted and secured with a  purse-string suture of 0 Vicryl.  Pneumoperitoneum was created.  The video  camera was inserted with visualization and findings as described above.  A  10 mm trocar was placed in the subxiphoid region, and two 5 mm trocars were  placed in the right mid abdomen.  The gallbladder fundus was elevated.  The  infundibulum was identified.  We dissected out the cystic duct and  the  cystic artery.  We isolated the cystic artery as it went along the wall of  the gallbladder.  It was marked with bowel clips and divided.  We created a  large window behind the cystic duct.  We identified the region of the common  bile duct.  A cholangiogram catheter was inserted into the cystic duct, but  we could not get prograde flow.  We examined the cystic duct extensively  from several angles and did not find any palpable mass or stone.  We tried  both the [facility] catheter and the Reddick catheter and still could not get  flow, so we abandoned the cholangiogram.  We secured the cystic duct with  multiple metal clips and divided it.  The gallbladder was dissected from its  bed with electrocautery.  I placed it in a specimen bag and removed.  The  operative field was copiously irrigated with 1000 cc of saline.  At the  completion of the case, there was no bleeding and  no bile leak whatsoever.  The irrigation fluid was completely clear.  The trocars were removed under  direct vision.  There was no bleeding from the trocar sites.  The  pneumoperitoneum was released.  The fascia at the umbilicus was closed with  0 Vicryl sutures.  The skin incisions were closed with subcuticular sutures  of 4-0 Vicryl and Steri-Strips.  Clean bandages were placed.  The patient  was taken to the recovery room in stable condition.  Estimated blood loss  was about 10 cc.  Complications were none.  Sponge, needle, and instrument  counts were correct.      Angelia Mould. Derrell Lolling, M.D.  Electronically Signed     HMI/MEDQ  D:  03/11/2005  T:  03/11/2005  Job:  [id number]   cc:   Tammy R. [place], M.D.  Fax: [phone]

## 2010-09-03 NOTE — Op Note (Signed)
. [facility]  Patient:    [name]                       MRN: [id number] Proc. Date: 04/29/99 Adm. Date:  [id number] Attending:  Melody Comas.                           Operative Report  PREOPERATIVE DIAGNOSIS:  Multiple facial and neck skin lesions (x 7), darkly pigmented.  POSTOPERATIVE DIAGNOSIS:  Multiple facial and neck skin lesions (x 7), darkly pigmented -- pending final pathology.  OPERATION PERFORMED:  Excisional biopsy of multiple skin lesions of the face and neck (x 7, all 3-7 mm in diameter).  SURGEON:  Janet Berlin. Dan Humphreys, M.D.  ANESTHESIA:  Local.  INDICATIONS:  [name] was referred to me for definitive management of several skin lesions involving the head and neck.  These are either darkly pigmented or  polypoid in configuration, subject to trauma by her clothing.  She is taken to he operating room at this time for excisional biopsy.  DESCRIPTION OF PROCEDURE:  The patient was identified and then brought to the monitored room in the [facility].  She was placed on the table in the right lateral decubitus position.  I sterilely prepped the base of the left neck and medial shoulder area.  Each of the three lesions to be excised in this  area were infiltrated at their bases with 1% Xylocaine containing epinephrine. The lesions were then amputated at the base, taking care to remove all pigmented skin and dermal elements.  The lesion at the medial base of the neck was actually three separate lesions in close proximity.  This was submitted as a separate specimen, as were each of the other two lesions.  The resultant defects were then closed with interrupted, monofilament 6.0 nylon sutures.  Bacitracin ointment was applied, followed by application of sterile band-aids.  The patient was then turned to the left lateral decubitus position and the right face and neck sterilely prepped and draped.   These lesions tended to be more of the darker pigmented variety and less polypoid.  Each of these were sequentially excised and submitted as separate specimens.  Resultant defects were then closed as previously described.  The areas were also then dressed as previously described. SUMMARY:  The patient tolerated the procedure well and left the operating room n stable condition.  She was given wound care instructions.  I will see her back n the office in five days for a follow up; at which time the sutures will likely e removed and her final pathology report should be available for review. DD:  04/29/99 TD:  04/29/99 Job: [id number]

## 2010-09-03 NOTE — Op Note (Signed)
[name], [name]               ACCOUNT NO.:  [id number]   MEDICAL RECORD NO.:  [id number]          PATIENT TYPE:  AMB   LOCATION:  [facility]                           FACILITY:  [facility]   PHYSICIAN:  James A. Ashley Royalty, M.D.DATE OF BIRTH:  February 06, 1981   DATE OF PROCEDURE:  02/11/2005  DATE OF DISCHARGE:                                 OPERATIVE REPORT   PREOPERATIVE DIAGNOSES:  1.  Pelvic pain.  2.  Possible bicornuate uterus.  3.  Uterine fibroids.   POSTOPERATIVE DIAGNOSES:  1.  Pelvic pain.  2.  Possible bicornuate uterus.  3.  Uterine fibroids.   PROCEDURE:  Diagnostic laparoscopy.   SURGEON:  Rudy Jew. Ashley Royalty, MD   ANESTHESIA:  General.   ESTIMATED BLOOD LOSS:  Less than 25 mL.   COMPLICATIONS:  None.   PACKS AND DRAINS:  None.   PROCEDURE:  The patient was taken to the operating room and placed in dorsal  supine position.  After general anesthesia was administered, she was placed  in the lithotomy position and prepped and draped in the usual manner for  abdominal and vaginal surgery.  Posterior weighted retractor was placed per  vagina.  The anterior lip of the cervix was grasped with a single-tooth  tenaculum.  Darco uterine manipulator was placed in the cervix and held in  place with tenaculum.  The bladder was drained with a Foley catheter which  was left in place throughout the procedure.  A 1.5 cm infraumbilical  incision was made in the longitudinal plane.  The Veress needle was inserted  into the abdominal cavity and its location verified by instillation of  saline and hanging drop techniques.  Next, approximately 3 liters of CO2  were instilled with 1 Lpm to create a pneumoperitoneum.  Next, size 10-11  disposable laparoscopic trocar was placed into the abdominal cavity.  Its  location was verified by placement of the laparoscope.  There was no  evidence of any trauma.  Pneumoperitoneum was maintained throughout the  procedure.  Next, a 5 mm suprapubic trocar  was placed in the left lower  quadrant using transillumination and direct visualization techniques.  The  pelvis was then thoroughly surveyed.  Careful examination of the uterine  corpus revealed very subtle depressions centrally in the fundus, consistent  with the ultrasonographic diagnosis of bicornate uterus.  However, it was  extremely subtle and would probably have been called a bicornuate uterus had  the ultrasound not prompted the surgeon to look for that finding.  Appropriate photos were obtained.  There was a small approximately 1 cm  fibroid noted on the anterior aspect of the fundus.  Appeared to be  subserosal in nature.  No additional abnormalities were noted.  The  fallopian tubes were normal size, shape, contour, and length bilaterally.  The ovaries were normal size, shape, and contour without evidence of any  endometriosis or cysts.  The remainder of the peritoneal surfaces were  smooth and glistening.  There was no evidence of any endometriosis or other  adhesive prostheses.  The appendix was visualized and appeared to be normal  as well.  Appropriate photos were obtained.   At this point, the patient was felt to have benefited maximally from the  surgical procedure.  The abdominal instruments were removed and  pneumoperitoneum evacuated.  Fascial defects were closed with 0 Vicryl in an  interrupted fashion.  The skin was closed with 3-0 Monocryl in a  subcuticular fashion.  The vaginal instruments were removed, hemostasis  noted, and the procedure terminated.   The patient tolerated the procedure extremely well and was returned to the  recovery room in good condition.      James A. Ashley Royalty, M.D.  Electronically Signed     JAM/MEDQ  D:  02/11/2005  T:  02/11/2005  Job:  [id number]

## 2010-09-03 NOTE — Op Note (Signed)
[facility]  Patient:    [name], [name]                    MRN: [id number] Proc. Date: 09/23/99 Adm. Date:  [id number] Disc. Date: [id number] Attending:  Marlowe Kays Page                           Operative Report  PREOPERATIVE DIAGNOSIS:  Internal derangement right knee.  POSTOPERATIVE DIAGNOSIS:  Grade 4 chondromalacia, medial femoral condyle right knee.  OPERATION:  Right knee arthroscopy with shaving and debridement of medial femoral condyle.  SURGEON:  Illene Labrador. Aplington, M.D.  ASSISTANT:  Nurse.  ANESTHESIA:  General.  FINDINGS AND JUSTIFICATION FOR PROCEDURE:  I arthroscoped her right knee on February 17, 1997, at which time I found some chondromalacia of the medial femoral condyle.  She had done well until March 24, or almost three months ago, when she was dancing in church and subsequently noted pain and difficulty walking.  X-rays were normal.  An MRI has also been normal, but because of persistent pain over the inner knee and medial femoral condyle, she is here today for the arthroscopic evaluation and treatment as indicated.  See operative description below for additional details of pathology.  DESCRIPTION OF PROCEDURE:  Satisfactory general anesthesia, pneumatic tourniquet, thigh stabilizer.  The right knee was prepped with Duraprep, draped in a sterile field.  Superomedial saline inflow.  First through anterolateral portal the medial compartment of the knee joint was evaluated. Her medial joint surface showed some remnant of previous injury, which had healed and left some scar.  Her medial meniscus was intact.  There was a moderate synovitis present, which could have been the deforming problem or a contributing problem, and this was resected with the 3.5 shaver.  The basic pathology was right where she hurt, and that is the medial femoral condyle not on the weightbearing surface but over the inner aspect, where there was  some fragmentation and partial detachment of articular cartilage.  I first tried debriding this off with baskets with little success and then began a combination of shaving with 3.5 shaver and smoothing with a rasp, and found that basically she had full-thickness disruption of the articular cartilage at this location so that with gentle debridement we basically went down to raw bone.  Fortunately, this was not on the weightbearing surface.  I kept debriding until the surrounding edges of the crater were smooth.  We then looked up in the suprapatellar area.  Her patellar surface was normal-appearing.  I took a representative picture of this.  I then reversed portals.  Laterally, she had some evidence of injury to her articular cartilage there as well in the lateral femoral condyle, but nothing that needed treatment.  Her lateral meniscus was intact, her ACL was intact, and looking well within the suprapatellar area, no abnormalities were noted.  I then irrigated the knee joint until clear and all fluid possible was removed. The two anterior portals were closed with 4-0 nylon.  Marcaine 0.5% with adrenalin 20 cc and 4 mg of morphine were then instilled through the inflow apparatus, which was removed and this portal closed with 4-0 nylon as well. Betadine, Adaptic, and a dry sterile dressing were applied, the tourniquet was released.  She tolerated the procedure well and was taken to recovery in satisfactory condition with no known complications. DD:  09/23/99 TD:  09/28/99 Job: [id number]

## 2010-09-03 NOTE — Procedures (Signed)
[facility]  Patient:    [name], [name]                        MRN: [id number] Proc. Date: 07/24/00 Attending:  Alinda Deem, M.D.                           Procedure Report  PREOPERATIVE DIAGNOSIS:  Chondromalacia of the medial femoral condyle and patella of the right knee.  POSTOPERATIVE DIAGNOSIS:  Chondromalacia of the medial femoral condyle, grade II-III, of the right knee.  PROCEDURE:  Right knee arthroscopic debridement of chondromalacia of the medial femoral condyle.  SURGEON:  Alinda Deem, M.D.  FIRST ASSISTANT:  ______  ANESTHETIC:  General LMA.  ESTIMATED BLOOD LOSS: Minimal.  FLUID REPLACEMENT:  800 cc of crystalloid.  DRAINS PLACED:  None.  TOURNIQUET TIME:  None.  INDICATIONS:  This is a 30 year old woman who has had previous arthroscopic surgery of her right knee by another physician with continued pain. She has failed conservative treatment, anti-inflammatory medicines, and physical therapy and reports increasing pain in the peripatellar and medial side of the right knee.  MRI scan showed some chondromalacia of the medial femoral condyle and facet of the patella, and she desires elective arthroscopic evaluation and treatment of her right knee.  DESCRIPTION OF PROCEDURE:  The patient was identified by arm band and was taken to the operating room at [facility] day surgery.  Appropriate anesthetic monitors were attached.  General LMA anesthesia was induced with the patient in the supine position.  Lateral posts were applied to the table, and the right lower extremity was prepped and draped in the usual sterile fashion from the ankle to the midthigh.  Examination under anesthesia revealed some hyperextension of the knee that was present prior to the anesthetic with slight laxity to all ligaments of both knees.  Lachmans test was negative. Pivot glide test was 1+ positive.  At this point, I went ahead and  made standard inferomedial and inferolateral parapatellar portals with a #11 blade, and the arthroscope was introduced into the right knee through the inferior lateral portal.  A diagnostic arthroscopy of the suprapatellar pouch, patella, and trochlea revealed normal configuration of the trochlea and patella.  There was a remnant of the plica still left that was removed.  On the medial side, the medial femoral condyle had grade II to grade III chondromalacia as one flexed the knee between 60 and 90 degrees, and this was debrided back to ______ margin with a 3.5 mm gator sucker shaver.  The medial meniscus, medial tibial condyle, lateral femoral condyle, and lateral tibial condyle, as well as the lateral meniscus were all intact.  The cruciate ligaments were intact.  The gutters were cleared, and the scope was taken medial and lateral of the PCL, clearing this region as well.  At this point, the knee was washed out with normal saline solution.  The arthroscopic instruments were removed.  The dressing was Xeroform, ______ dressings, sponges.  Webril and an Ace wrap were applied.  The patient was then awakened and taken to the recovery room without difficulty. DD:  07/24/00 TD:  07/24/00 Job: [id number]

## 2010-09-21 ENCOUNTER — Emergency Department (HOSPITAL_COMMUNITY)
Admission: EM | Admit: 2010-09-21 | Discharge: 2010-09-21 | Discharge status: Against Medical Advice | Payer: Self-pay | Attending: Emergency Medicine | Admitting: Emergency Medicine

## 2010-09-21 DIAGNOSIS — R42 Dizziness and giddiness: Secondary | ICD-10-CM | POA: Insufficient documentation

## 2010-09-21 DIAGNOSIS — R040 Epistaxis: Secondary | ICD-10-CM | POA: Insufficient documentation

## 2010-09-21 DIAGNOSIS — R51 Headache: Secondary | ICD-10-CM | POA: Insufficient documentation

## 2011-01-06 LAB — URINALYSIS, ROUTINE W REFLEX MICROSCOPIC
Bilirubin Urine: NEGATIVE
Glucose, UA: NEGATIVE
Ketones, ur: NEGATIVE
Ketones, ur: NEGATIVE
Nitrite: NEGATIVE
Protein, ur: NEGATIVE
Specific Gravity, Urine: 1.008
pH: 6
pH: 6.5

## 2011-01-06 LAB — COMPREHENSIVE METABOLIC PANEL
ALT: 42 — ABNORMAL HIGH
ALT: 18
AST: 18
Albumin: 3.6
Alkaline Phosphatase: 105
BUN: 6
CO2: 28
CO2: 24
Chloride: 106
Chloride: 108
Creatinine, Ser: 0.79
GFR calc Af Amer: >60
GFR calc non Af Amer: >60
GFR calc non Af Amer: >60
Glucose, Bld: 89
Potassium: 3.9
Potassium: 3.5
Sodium: 142
Sodium: 139
Total Bilirubin: 0.5
Total Bilirubin: 0.6
Total Protein: 6.2

## 2011-01-06 LAB — DIFFERENTIAL
Basophils Absolute: 0.3 — ABNORMAL HIGH
Basophils Absolute: 0.1
Basophils Relative: 3 — ABNORMAL HIGH
Eosinophils Absolute: 0.2
Eosinophils Absolute: 0.2
Eosinophils Relative: 1
Lymphocytes Relative: 26
Monocytes Absolute: 1
Neutro Abs: 4.4
Neutrophils Relative %: 47

## 2011-01-06 LAB — CBC
HCT: 39.4
HCT: 36.8
Hemoglobin: 13.2
Hemoglobin: 12.5
MCHC: 34.1
Platelets: 383
RBC: 4.97
RDW: 14.3
RDW: 14.6

## 2011-01-06 LAB — WET PREP, GENITAL

## 2011-01-06 LAB — PREGNANCY, URINE: Preg Test, Ur: NEGATIVE

## 2011-01-06 LAB — GC/CHLAMYDIA PROBE AMP, GENITAL: GC Probe Amp, Genital: NEGATIVE

## 2011-01-10 LAB — POCT PREGNANCY, URINE
Operator id: 257131
Preg Test, Ur: NEGATIVE

## 2011-01-10 LAB — DIFFERENTIAL
Lymphs Abs: 0.8
Monocytes Absolute: 0.4
Monocytes Relative: 4
Neutro Abs: 9.3 — ABNORMAL HIGH
Neutrophils Relative %: 88 — ABNORMAL HIGH

## 2011-01-10 LAB — CBC
Hemoglobin: 12.1
MCV: 78.1
RBC: 4.62
WBC: 10.5

## 2011-01-10 LAB — POCT CARDIAC MARKERS
Myoglobin, poc: 65.2
Operator id: 257131
Troponin i, poc: <0.05

## 2011-01-10 LAB — POCT I-STAT, CHEM 8
HCT: 38
Hemoglobin: 12.9
Sodium: 141
TCO2: 23

## 2011-01-12 LAB — POCT I-STAT, CHEM 8
Creatinine, Ser: 1
Glucose, Bld: 88
Hemoglobin: 13.3
Potassium: 3.9
TCO2: 26

## 2011-01-12 LAB — URINALYSIS, ROUTINE W REFLEX MICROSCOPIC
Bilirubin Urine: NEGATIVE
Ketones, ur: NEGATIVE
Nitrite: NEGATIVE
Protein, ur: NEGATIVE
Urobilinogen, UA: 0.2

## 2011-01-13 LAB — COMPREHENSIVE METABOLIC PANEL
AST: 18
Albumin: 3.4 — ABNORMAL LOW
Alkaline Phosphatase: 63
CO2: 26
Creatinine, Ser: 0.67
GFR calc non Af Amer: >60
Sodium: 138
Total Bilirubin: 0.7

## 2011-01-13 LAB — CBC
Hemoglobin: 12.4
MCHC: 34.4
RDW: 18 — ABNORMAL HIGH

## 2011-01-14 LAB — URINALYSIS, ROUTINE W REFLEX MICROSCOPIC
Bilirubin Urine: NEGATIVE
Glucose, UA: NEGATIVE
Hgb urine dipstick: NEGATIVE
Ketones, ur: NEGATIVE
Protein, ur: NEGATIVE
Urobilinogen, UA: 0.2

## 2011-01-25 LAB — BASIC METABOLIC PANEL
BUN: 5 — ABNORMAL LOW
CO2: 22
Calcium: 8.5
Chloride: 109
Creatinine, Ser: 0.82
Glucose, Bld: 100 — ABNORMAL HIGH

## 2011-01-25 LAB — DIFFERENTIAL
Eosinophils Absolute: 0
Eosinophils Relative: 0
Lymphs Abs: 3
Monocytes Absolute: 0.7
Neutrophils Relative %: 71

## 2011-01-25 LAB — CBC
MCHC: 33.4
MCV: 80.7
Platelets: 406 — ABNORMAL HIGH
RBC: 4.15
RDW: 13.3

## 2011-01-25 LAB — POCT CARDIAC MARKERS: CKMB, poc: <1 — ABNORMAL LOW

## 2011-03-26 ENCOUNTER — Emergency Department (HOSPITAL_BASED_OUTPATIENT_CLINIC_OR_DEPARTMENT_OTHER)
Admission: EM | Admit: 2011-03-26 | Discharge: 2011-03-26 | Disposition: A | Discharge status: Home / Self Care | Payer: Self-pay | Attending: Emergency Medicine | Admitting: Emergency Medicine

## 2011-03-26 ENCOUNTER — Emergency Department (INDEPENDENT_AMBULATORY_CARE_PROVIDER_SITE_OTHER): Payer: Self-pay

## 2011-03-26 ENCOUNTER — Encounter: Payer: Self-pay | Admitting: Emergency Medicine

## 2011-03-26 DIAGNOSIS — Y92838 Other recreation area as the place of occurrence of the external cause: Secondary | ICD-10-CM | POA: Insufficient documentation

## 2011-03-26 DIAGNOSIS — Y9239 Other specified sports and athletic area as the place of occurrence of the external cause: Secondary | ICD-10-CM | POA: Insufficient documentation

## 2011-03-26 DIAGNOSIS — W19XXXA Unspecified fall, initial encounter: Secondary | ICD-10-CM | POA: Insufficient documentation

## 2011-03-26 DIAGNOSIS — Y93A3 Activity, aerobic and step exercise: Secondary | ICD-10-CM | POA: Insufficient documentation

## 2011-03-26 DIAGNOSIS — S8992XA Unspecified injury of left lower leg, initial encounter: Secondary | ICD-10-CM

## 2011-03-26 DIAGNOSIS — M25569 Pain in unspecified knee: Secondary | ICD-10-CM

## 2011-03-26 DIAGNOSIS — X58XXXA Exposure to other specified factors, initial encounter: Secondary | ICD-10-CM

## 2011-03-26 DIAGNOSIS — S8990XA Unspecified injury of unspecified lower leg, initial encounter: Secondary | ICD-10-CM | POA: Insufficient documentation

## 2011-03-26 HISTORY — DX: Other injury of unspecified body region, initial encounter: T14.8XXA

## 2011-03-26 MED ORDER — HYDROCODONE-ACETAMINOPHEN 5-325 MG PO TABS: 1.0000 | ORAL_TABLET | ORAL | Status: AC | PRN

## 2011-03-26 MED ORDER — HYDROCODONE-ACETAMINOPHEN 5-325 MG PO TABS
2.0000 | ORAL_TABLET | Freq: Once | ORAL | Status: AC
  Administered 2011-03-26: 2 via ORAL
  Filled 2011-03-26: qty 2

## 2011-03-26 NOTE — ED Provider Notes (Signed)
History     CSN: [id number] Arrival date & time: 03/26/2011 11:08 AM   First MD Initiated Contact with Patient 03/26/11 1137      Chief Complaint  Patient presents with  . Knee Injury    (Consider location/radiation/quality/duration/timing/severity/associated sxs/prior treatment) HPI Comments: Patient was at the gym and doing a bench step up maneuver and as she stepped down she felt and heard a left knee posterior pop.  She then fell down.  She did not hit her knee anything.  No prior knee injuries.  No redness, swelling or erythema.  Patient denies any hip or ankle pain.  She has been unable to bear weight since the incident.  Patient is a 30 y.o. female presenting with knee pain. The history is provided by the patient. No language interpreter was used.  Knee Pain This is a new problem. The current episode started less than 1 hour ago. The problem occurs constantly. The problem has not changed since onset.Pertinent negatives include no chest pain, no abdominal pain, no headaches and no shortness of breath. The symptoms are aggravated by walking. The symptoms are relieved by nothing. She has tried nothing for the symptoms.    Past Medical History  Diagnosis Date  . Nerve damage     Past Surgical History  Procedure Date  . Cholecystectomy   . Cesarean section     No family history on file.  History  Substance Use Topics  . Smoking status: Never Smoker   . Smokeless tobacco: Not on file  . Alcohol Use: Yes     social    OB History    Grav Para Term Preterm Abortions TAB SAB Ect Mult Living                  Review of Systems  Constitutional: Negative.  Negative for fever and chills.  HENT: Negative.   Eyes: Negative.  Negative for discharge and redness.  Respiratory: Negative.  Negative for cough and shortness of breath.   Cardiovascular: Negative.  Negative for chest pain.  Gastrointestinal: Negative.  Negative for nausea, vomiting, abdominal pain and diarrhea.    Genitourinary: Negative.  Negative for dysuria and vaginal discharge.  Musculoskeletal: Positive for arthralgias. Negative for back pain.  Skin: Negative.  Negative for color change and rash.  Neurological: Negative.  Negative for syncope and headaches.  Hematological: Negative.  Negative for adenopathy.  Psychiatric/Behavioral: Negative.  Negative for confusion.  All other systems reviewed and are negative.    Allergies  Augmentin; Vigamox; and Latex  Home Medications   Current Outpatient Rx  Name Route Sig Dispense Refill  . AMITRIPTYLINE HCL 150 MG PO TABS Oral Take 150 mg by mouth at bedtime.      Marland Kitchen GABAPENTIN 300 MG PO CAPS Oral Take 300 mg by mouth 6 (six) times daily.        BP 129/75  Pulse 106  Temp(Src) 98.5 F (36.9 C) (Oral)  Resp 16  SpO2 99%  LMP 03/19/2011  Physical Exam  Constitutional: She is oriented to person, place, and time. She appears well-developed and well-nourished.  HENT:  Head: Normocephalic and atraumatic.  Eyes: Conjunctivae and EOM are normal. Pupils are equal, round, and reactive to light.  Neck: Normal range of motion. Neck supple.  Pulmonary/Chest: Effort normal.  Musculoskeletal:       Patient's left knee has no erythema or crepitus.  She has tenderness medially and laterally.  She can flex and extend but has pain with doing  so.  No tenderness over her ankle or hip.  Patient unable to bear weight here.  Neurological: She is alert and oriented to person, place, and time.  Skin: Skin is warm and dry. No rash noted. No erythema.  Psychiatric: She has a normal mood and affect. Her behavior is normal. Judgment and thought content normal.    ED Course  Procedures (including critical care time)  Labs Reviewed - No data to display Dg Knee Complete 4 Views Left  03/26/2011  *RADIOLOGY REPORT*  Clinical Data: Left knee pain/injury  LEFT KNEE - COMPLETE 4+ VIEW  Comparison: None.  Findings: No fracture or dislocation is seen.  The joint spaces  are preserved.  The visualized soft tissues are unremarkable.  No suprapatellar knee joint effusion.  IMPRESSION: No acute osseous abnormality is seen.  Original Report Authenticated By: Charline Bills, M.D.     No diagnosis found.    MDM  Patient presents with symptoms concerning for a possible anterior cruciate ligament or PCL tear or meniscus injury.  There is no mechanism for fracture and no fracture dislocation seen on her knee x-ray.  Patient will be given a knee immobilizer and crutches and made weightbearing as tolerated.  I will discharge her with pain medicine and instructions to followup with the orthopedic surgeon this week.        Nat Christen, MD 03/26/11 [id number]

## 2011-03-26 NOTE — ED Notes (Signed)
Pt c/o LT knee pain after it "buckled" while doing step-ups at the gym.

## 2011-04-04 ENCOUNTER — Other Ambulatory Visit: Payer: Self-pay | Admitting: Orthopaedic Surgery

## 2011-04-04 DIAGNOSIS — M25562 Pain in left knee: Secondary | ICD-10-CM

## 2011-04-07 ENCOUNTER — Inpatient Hospital Stay: Admission: RE | Admit: 2011-04-07 | Payer: Self-pay | Source: Ambulatory Visit

## 2011-04-18 ENCOUNTER — Inpatient Hospital Stay: Admission: RE | Admit: 2011-04-18 | Payer: Self-pay | Source: Ambulatory Visit

## 2011-10-03 ENCOUNTER — Emergency Department (HOSPITAL_COMMUNITY)
Admission: EM | Admit: 2011-10-03 | Discharge: 2011-10-03 | Disposition: A | Discharge status: Home / Self Care | Payer: Self-pay | Attending: Emergency Medicine | Admitting: Emergency Medicine

## 2011-10-03 ENCOUNTER — Encounter (HOSPITAL_COMMUNITY): Payer: Self-pay | Admitting: Emergency Medicine

## 2011-10-03 ENCOUNTER — Emergency Department (HOSPITAL_COMMUNITY): Payer: Self-pay

## 2011-10-03 DIAGNOSIS — R42 Dizziness and giddiness: Secondary | ICD-10-CM | POA: Insufficient documentation

## 2011-10-03 DIAGNOSIS — R51 Headache: Secondary | ICD-10-CM | POA: Insufficient documentation

## 2011-10-03 DIAGNOSIS — S0993XA Unspecified injury of face, initial encounter: Secondary | ICD-10-CM | POA: Insufficient documentation

## 2011-10-03 DIAGNOSIS — W19XXXA Unspecified fall, initial encounter: Secondary | ICD-10-CM | POA: Insufficient documentation

## 2011-10-03 DIAGNOSIS — R4182 Altered mental status, unspecified: Secondary | ICD-10-CM | POA: Insufficient documentation

## 2011-10-03 HISTORY — DX: Depression, unspecified: F32.A

## 2011-10-03 HISTORY — DX: Major depressive disorder, single episode, unspecified: F32.9

## 2011-10-03 LAB — COMPREHENSIVE METABOLIC PANEL
ALT: 21 U/L (ref 0–35)
AST: 24 U/L (ref 0–37)
CO2: 27 mEq/L (ref 19–32)
Calcium: 9.2 mg/dL (ref 8.4–10.5)
Creatinine, Ser: 0.84 mg/dL (ref 0.50–1.10)
GFR calc Af Amer: >90 mL/min (ref >90)
GFR calc non Af Amer: >90 mL/min (ref >90)
Glucose, Bld: 102 mg/dL — ABNORMAL HIGH (ref 70–99)
Sodium: 144 mEq/L (ref 135–145)
Total Protein: 7 g/dL (ref 6.0–8.3)

## 2011-10-03 LAB — DIFFERENTIAL
Lymphs Abs: 2 10*3/uL (ref 0.7–4.0)
Monocytes Absolute: 0.5 10*3/uL (ref 0.1–1.0)
Monocytes Relative: 5 % (ref 3–12)
Neutro Abs: 7.3 10*3/uL (ref 1.7–7.7)
Neutrophils Relative %: 74 % (ref 43–77)

## 2011-10-03 LAB — RAPID URINE DRUG SCREEN, HOSP PERFORMED
Amphetamines: NOT DETECTED
Barbiturates: NOT DETECTED
Benzodiazepines: NOT DETECTED
Cocaine: NOT DETECTED
Tetrahydrocannabinol: NOT DETECTED

## 2011-10-03 LAB — CBC
HCT: 39.5 % (ref 36.0–46.0)
Hemoglobin: 13.3 g/dL (ref 12.0–15.0)
RBC: 4.49 MIL/uL (ref 3.87–5.11)

## 2011-10-03 LAB — POCT PREGNANCY, URINE: Preg Test, Ur: NEGATIVE

## 2011-10-03 MED ORDER — METOCLOPRAMIDE HCL 5 MG/ML IJ SOLN
10.0000 mg | Freq: Once | INTRAMUSCULAR | Status: AC
  Administered 2011-10-03: 10 mg via INTRAVENOUS
  Filled 2011-10-03: qty 2

## 2011-10-03 MED ORDER — MECLIZINE HCL 25 MG PO TABS
50.0000 mg | ORAL_TABLET | Freq: Once | ORAL | Status: AC
  Administered 2011-10-03: 50 mg via ORAL
  Filled 2011-10-03: qty 2

## 2011-10-03 MED ORDER — DIAZEPAM 5 MG/ML IJ SOLN
5.0000 mg | Freq: Once | INTRAMUSCULAR | Status: AC
  Administered 2011-10-03: 5 mg via INTRAVENOUS
  Filled 2011-10-03: qty 2

## 2011-10-03 MED ORDER — DIPHENHYDRAMINE HCL 50 MG/ML IJ SOLN
25.0000 mg | Freq: Once | INTRAMUSCULAR | Status: AC
  Administered 2011-10-03: 25 mg via INTRAVENOUS
  Filled 2011-10-03: qty 1

## 2011-10-03 MED ORDER — KETOROLAC TROMETHAMINE 30 MG/ML IJ SOLN
30.0000 mg | Freq: Once | INTRAMUSCULAR | Status: AC
  Administered 2011-10-03: 30 mg via INTRAVENOUS
  Filled 2011-10-03: qty 1

## 2011-10-03 MED ORDER — OXYCODONE-ACETAMINOPHEN 5-325 MG PO TABS
2.0000 | ORAL_TABLET | Freq: Once | ORAL | Status: AC
  Administered 2011-10-03: 2 via ORAL
  Filled 2011-10-03: qty 2

## 2011-10-03 NOTE — ED Notes (Signed)
Per pts mom pt c/o of being dizzy this am then mom heard a thump and went in br to see what happed and  Pt was sitting on commode and then mom hear another thump pt was on floor.. Mom states  That is when pt  Started talking gibberish.  Pt will miss face w/arm dropp and will answer questions in whisper and has hx of depression

## 2011-10-03 NOTE — ED Notes (Signed)
Pt very teary eyed,, crying, difficult understanding pt. Reports felt dizzy this am which caused fall. No LOC

## 2011-10-03 NOTE — ED Notes (Signed)
Gave old and new ECG to Dr. Vonna Kotyk after I performed. 10:07 am JG.

## 2011-10-03 NOTE — ED Notes (Signed)
C-collar removed per ED MD.

## 2011-10-03 NOTE — ED Notes (Signed)
Patient transported to CT 

## 2011-10-03 NOTE — Discharge Instructions (Signed)
You may have had a seizure today. Your CT, MRI, and labwork were normal appearing today. It is possible that the amitriptyline may have caused this. Do not drive a car until seen and cleared by neurology. Call [facility] in the AM to see if you can be seen before Friday; otherwise, keep your appointment on Friday. Return to the ED with recurrent or new symptoms, or otherwise worsening condition.  Headaches, Frequently Asked Questions MIGRAINE HEADACHES Q: What is migraine? What causes it? How can I treat it? A: Generally, migraine headaches begin as a dull ache. Then they develop into a constant, throbbing, and pulsating pain. You may experience pain at the temples. You may experience pain at the front or back of one or both sides of the head. The pain is usually accompanied by a combination of:  Nausea.   Vomiting.   Sensitivity to light and noise.  Some people (about 15%) experience an aura (see below) before an attack. The cause of migraine is believed to be chemical reactions in the brain. Treatment for migraine may include over-the-counter or prescription medications. It may also include self-help techniques. These include relaxation training and biofeedback.  Q: What is an aura? A: About 15% of people with migraine get an "aura". This is a sign of neurological symptoms that occur before a migraine headache. You may see wavy or jagged lines, dots, or flashing lights. You might experience tunnel vision or blind spots in one or both eyes. The aura can include visual or auditory hallucinations (something imagined). It may include disruptions in smell (such as strange odors), taste or touch. Other symptoms include:  Numbness.   A "pins and needles" sensation.   Difficulty in recalling or speaking the correct word.  These neurological events may last as long as 60 minutes. These symptoms will fade as the headache begins. Q: What is a trigger? A: Certain physical or environmental factors can lead to  or "trigger" a migraine. These include:  Foods.   Hormonal changes.   Weather.   Stress.  It is important to remember that triggers are different for everyone. To help prevent migraine attacks, you need to figure out which triggers affect you. Keep a headache diary. This is a good way to track triggers. The diary will help you talk to your healthcare professional about your condition. Q: Does weather affect migraines? A: Bright sunshine, hot, humid conditions, and drastic changes in barometric pressure may lead to, or "trigger," a migraine attack in some people. But studies have shown that weather does not act as a trigger for everyone with migraines. Q: What is the link between migraine and hormones? A: Hormones start and regulate many of your body's functions. Hormones keep your body in balance within a constantly changing environment. The levels of hormones in your body are unbalanced at times. Examples are during menstruation, pregnancy, or menopause. That can lead to a migraine attack. In fact, about three quarters of all women with migraine report that their attacks are related to the menstrual cycle.  Q: Is there an increased risk of stroke for migraine sufferers? A: The likelihood of a migraine attack causing a stroke is very remote. That is not to say that migraine sufferers cannot have a stroke associated with their migraines. In persons under age 56, the most common associated factor for stroke is migraine headache. But over the course of a person's normal life span, the occurrence of migraine headache may actually be associated with a reduced risk of  dying from cerebrovascular disease due to stroke.  Q: What are acute medications for migraine? A: Acute medications are used to treat the pain of the headache after it has started. Examples over-the-counter medications, NSAIDs, ergots, and triptans.  Q: What are the triptans? A: Triptans are the newest class of abortive medications. They  are specifically targeted to treat migraine. Triptans are vasoconstrictors. They moderate some chemical reactions in the brain. The triptans work on receptors in your brain. Triptans help to restore the balance of a neurotransmitter called serotonin. Fluctuations in levels of serotonin are thought to be a main cause of migraine.  Q: Are over-the-counter medications for migraine effective? A: Over-the-counter, or "OTC," medications may be effective in relieving mild to moderate pain and associated symptoms of migraine. But you should see your caregiver before beginning any treatment regimen for migraine.  Q: What are preventive medications for migraine? A: Preventive medications for migraine are sometimes referred to as "prophylactic" treatments. They are used to reduce the frequency, severity, and length of migraine attacks. Examples of preventive medications include antiepileptic medications, antidepressants, beta-blockers, calcium channel blockers, and NSAIDs (nonsteroidal anti-inflammatory drugs). Q: Why are anticonvulsants used to treat migraine? A: During the past few years, there has been an increased interest in antiepileptic drugs for the prevention of migraine. They are sometimes referred to as "anticonvulsants". Both epilepsy and migraine may be caused by similar reactions in the brain.  Q: Why are antidepressants used to treat migraine? A: Antidepressants are typically used to treat people with depression. They may reduce migraine frequency by regulating chemical levels, such as serotonin, in the brain.  Q: What alternative therapies are used to treat migraine? A: The term "alternative therapies" is often used to describe treatments considered outside the scope of conventional Western medicine. Examples of alternative therapy include acupuncture, acupressure, and yoga. Another common alternative treatment is herbal therapy. Some herbs are believed to relieve headache pain. Always discuss  alternative therapies with your caregiver before proceeding. Some herbal products contain arsenic and other toxins. TENSION HEADACHES Q: What is a tension-type headache? What causes it? How can I treat it? A: Tension-type headaches occur randomly. They are often the result of temporary stress, anxiety, fatigue, or anger. Symptoms include soreness in your temples, a tightening band-like sensation around your head (a "vice-like" ache). Symptoms can also include a pulling feeling, pressure sensations, and contracting head and neck muscles. The headache begins in your forehead, temples, or the back of your head and neck. Treatment for tension-type headache may include over-the-counter or prescription medications. Treatment may also include self-help techniques such as relaxation training and biofeedback. CLUSTER HEADACHES Q: What is a cluster headache? What causes it? How can I treat it? A: Cluster headache gets its name because the attacks come in groups. The pain arrives with little, if any, warning. It is usually on one side of the head. A tearing or bloodshot eye and a runny nose on the same side of the headache may also accompany the pain. Cluster headaches are believed to be caused by chemical reactions in the brain. They have been described as the most severe and intense of any headache type. Treatment for cluster headache includes prescription medication and oxygen. SINUS HEADACHES Q: What is a sinus headache? What causes it? How can I treat it? A: When a cavity in the bones of the face and skull (a sinus) becomes inflamed, the inflammation will cause localized pain. This condition is usually the result of an allergic reaction, a  tumor, or an infection. If your headache is caused by a sinus blockage, such as an infection, you will probably have a fever. An x-ray will confirm a sinus blockage. Your caregiver's treatment might include antibiotics for the infection, as well as antihistamines or  decongestants.  REBOUND HEADACHES Q: What is a rebound headache? What causes it? How can I treat it? A: A pattern of taking acute headache medications too often can lead to a condition known as "rebound headache." A pattern of taking too much headache medication includes taking it more than 2 days per week or in excessive amounts. That means more than the label or a caregiver advises. With rebound headaches, your medications not only stop relieving pain, they actually begin to cause headaches. Doctors treat rebound headache by tapering the medication that is being overused. Sometimes your caregiver will gradually substitute a different type of treatment or medication. Stopping may be a challenge. Regularly overusing a medication increases the potential for serious side effects. Consult a caregiver if you regularly use headache medications more than 2 days per week or more than the label advises. ADDITIONAL QUESTIONS AND ANSWERS Q: What is biofeedback? A: Biofeedback is a self-help treatment. Biofeedback uses special equipment to monitor your body's involuntary physical responses. Biofeedback monitors:  Breathing.   Pulse.   Heart rate.   Temperature.   Muscle tension.   Brain activity.  Biofeedback helps you refine and perfect your relaxation exercises. You learn to control the physical responses that are related to stress. Once the technique has been mastered, you do not need the equipment any more. Q: Are headaches hereditary? A: Four out of five (80%) of people that suffer report a family history of migraine. Scientists are not sure if this is genetic or a family predisposition. Despite the uncertainty, a child has a 50% chance of having migraine if one parent suffers. The child has a 75% chance if both parents suffer.  Q: Can children get headaches? A: By the time they reach high school, most young people have experienced some type of headache. Many safe and effective approaches or  medications can prevent a headache from occurring or stop it after it has begun.  Q: What type of doctor should I see to diagnose and treat my headache? A: Start with your primary caregiver. Discuss his or her experience and approach to headaches. Discuss methods of classification, diagnosis, and treatment. Your caregiver may decide to recommend you to a headache specialist, depending upon your symptoms or other physical conditions. Having diabetes, allergies, etc., may require a more comprehensive and inclusive approach to your headache. The National Head[employer] Foundation will provide, upon request, a list of [facility] physician members in your state. Document Released: 06/25/2003 Document Revised: 03/24/2011 Document Reviewed: 12/03/2007 [employer] Patient Information 2012 [place], [place].

## 2011-10-03 NOTE — ED Notes (Signed)
Visitor x 1 in wait area

## 2011-10-03 NOTE — ED Provider Notes (Signed)
12:51 PM Care of pt assumed in CDU from Dr. Ethelda Chick. Pt presented with ?seizure like activity this morning - fell in the bathroom, was "speaking gibberish", and her right hand was noted to be shaking per Mom. Pt has no hx of the same. Is being treated by [facility] for peripheral neuropathy with amitriptyline and gabapentin. On initial EDP exam pt had GCS 15, awake but slightly drowsy, slow to answer questions. No gross neuro abnormalities. Negative CT head, lab w/u. On return from CT pt began to c/o vertigo.  Plan: - MRI brain - Contact pt's neurologist at [facility], Dr. Judd Lien Wasim  3:00 PM Pt has returned from MRI - study negative. Continues to c/o dizziness and states she is having some residual vertigo. Valium given.   3:40 PM Talked with receptionist at [facility] neuro clinic who states Dr. Sonda Rumble, one of the residents is currently off service. Page placed to chief resident Dr. Merilynn Finland who's covering for Dr. Sonda Rumble.  4:50 PM Repaged.  6:00 PM Still no answer from resident. Pt states she is feeling better at this time and wishes to be discharged home. She states that she feels comfortable with contacting the neuro clinic in the morning. She already has an appt scheduled for Fri of this week for a nerve bx. Instructed on driving precautions, that amitriptyline could possibly be source of her sx. Reasons to return to ED discussed.  Grant Fontana, PA-C 10/03/11 2006

## 2011-10-03 NOTE — ED Notes (Signed)
Patient transported to MRI 

## 2011-10-03 NOTE — ED Provider Notes (Addendum)
History     CSN: [id number]  Arrival date & time 10/03/11  0909   First MD Initiated Contact with Patient 10/03/11 [phone]      Chief Complaint  Patient presents with  . Altered Mental Status    (Consider location/radiation/quality/duration/timing/severity/associated sxs/prior treatment) HPI Level V caveat altered mental status. Patient's mother divided history. Patient was getting ready for work this morning. States she felt dizzy. Mother noted her to be shaking at her right hand and does be speaking in gibberish after she heard a thump when patient had fallen on the floor. Patient presently complains of headache at the occipital region occipital region. She is currently treated for peripheral neuropathy with gabapentin. EMS treated patient with long board hard collar and CID obtained Accu-Chek which was 93 Past Medical History  Diagnosis Date  . Nerve damage   . Depression     Past Surgical History  Procedure Date  . Cholecystectomy   . Cesarean section     No family history on file.  History  Substance Use Topics  . Smoking status: Never Smoker   . Smokeless tobacco: Not on file  . Alcohol Use: Yes     social    OB History    Grav Para Term Preterm Abortions TAB SAB Ect Mult Living                  Review of Systems  Unable to perform ROS: Mental status change    Allergies  Amoxicillin-pot clavulanate; Vigamox; and Latex  Home Medications   Current Outpatient Rx  Name Route Sig Dispense Refill  . AMITRIPTYLINE HCL 150 MG PO TABS Oral Take 150 mg by mouth at bedtime.      Marland Kitchen GABAPENTIN 300 MG PO CAPS Oral Take 300 mg by mouth 6 (six) times daily.        BP 143/98  Pulse 104  Temp 98.8 F (37.1 C) (Oral)  Resp 18  SpO2 100%  Physical Exam  Nursing note and vitals reviewed. Constitutional: She appears well-developed and well-nourished.  HENT:  Head: Normocephalic and atraumatic.       Bilateral tympanic membranes normal  Eyes: Conjunctivae are  normal. Pupils are equal, round, and reactive to light.       Pupils 3 mm reactive to light  Neck: No tracheal deviation present. No thyromegaly present.       Tender over cervical spine  Cardiovascular: Normal rate and regular rhythm.   No murmur heard. Pulmonary/Chest: Effort normal and breath sounds normal.  Abdominal: Soft. Bowel sounds are normal. She exhibits no distension. There is no tenderness.       Morbidly obese  Musculoskeletal: Normal range of motion. She exhibits no edema and no tenderness.  Neurological: She displays normal reflexes. Coordination normal.       Follow simple commands moves all extremities cranial nerves II through XII grossly intact. Speaks in sentences sleepy arousable to verbal stimulus. Answers questions appropriately though somewhat slow to respond  Skin: Skin is warm and dry. No rash noted.  Psychiatric: She has a normal mood and affect.    ED Course  Procedures (including critical care time) 11:40 AM patient complains of severe headache, diffuse. She is alert Glasgow Coma Score 15 pocket to have moves all extremities motor strength 5 over 5 overall. 12:50 PM patient complains of dizziness i.e. sensation of room spinning worse with moving her head she is able walk unassisted gait mildly unsteady meclizine ordered MRI ordered to rule out  posterior circulation stroke in light of prior difficulty speaking Labs Reviewed - No data to display No results found.   No diagnosis found.  Date: 10/03/2011  Rate: 95  Rhythm: normal sinus rhythm  QRS Axis: normal  Intervals: normal  ST/T Wave abnormalities: nonspecific T wave changes  Conduction Disutrbances:nonspecific intraventricular conduction delay  Narrative Interpretation:   Old EKG Reviewed: Unchanged from July 08 2009  Results for orders placed during the hospital encounter of 10/03/11  COMPREHENSIVE METABOLIC PANEL      Component Value Range   Sodium 144  135 - 145 mEq/L   Potassium 4.3  3.5 -  5.1 mEq/L   Chloride 107  96 - 112 mEq/L   CO2 27  19 - 32 mEq/L   Glucose, Bld 102 (*) 70 - 99 mg/dL   BUN 6  6 - 23 mg/dL   Creatinine, Ser 1.61  0.50 - 1.10 mg/dL   Calcium 9.2  8.4 - 09.6 mg/dL   Total Protein 7.0  6.0 - 8.3 g/dL   Albumin 3.5  3.5 - 5.2 g/dL   AST 24  0 - 37 U/L   ALT 21  0 - 35 U/L   Alkaline Phosphatase 92  39 - 117 U/L   Total Bilirubin 0.2 (*) 0.3 - 1.2 mg/dL   GFR calc non Af Amer >90  >90 mL/min   GFR calc Af Amer >90  >90 mL/min  CBC      Component Value Range   WBC 10.0  4.0 - 10.5 K/uL   RBC 4.49  3.87 - 5.11 MIL/uL   Hemoglobin 13.3  12.0 - 15.0 g/dL   HCT 04.5  40.9 - 81.1 %   MCV 88.0  78.0 - 100.0 fL   MCH 29.6  26.0 - 34.0 pg   MCHC 33.7  30.0 - 36.0 g/dL   RDW 91.4  78.2 - 95.6 %   Platelets 230  150 - 400 K/uL  DIFFERENTIAL      Component Value Range   Neutrophils Relative 74  43 - 77 %   Neutro Abs 7.3  1.7 - 7.7 K/uL   Lymphocytes Relative 20  12 - 46 %   Lymphs Abs 2.0  0.7 - 4.0 K/uL   Monocytes Relative 5  3 - 12 %   Monocytes Absolute 0.5  0.1 - 1.0 K/uL   Eosinophils Relative 1  0 - 5 %   Eosinophils Absolute 0.1  0.0 - 0.7 K/uL   Basophils Relative 0  0 - 1 %   Basophils Absolute 0.0  0.0 - 0.1 K/uL  URINE RAPID DRUG SCREEN (HOSP PERFORMED)      Component Value Range   Opiates NONE DETECTED  NONE DETECTED   Cocaine NONE DETECTED  NONE DETECTED   Benzodiazepines NONE DETECTED  NONE DETECTED   Amphetamines NONE DETECTED  NONE DETECTED   Tetrahydrocannabinol NONE DETECTED  NONE DETECTED   Barbiturates NONE DETECTED  NONE DETECTED  POCT PREGNANCY, URINE      Component Value Range   Preg Test, Ur NEGATIVE  NEGATIVE   Ct Head Wo Contrast  10/03/2011  *RADIOLOGY REPORT*  Clinical Data: Altered mental status, status post fall  CT HEAD WITHOUT CONTRAST,CT CERVICAL SPINE WITHOUT CONTRAST  Technique:  Contiguous axial images were obtained from the base of the skull through the vertex without contrast.,Technique: Multidetector CT  imaging of the cervical spine was performed. Multiplanar CT image reconstructions were also generated.  Comparison: 03/13/2010  Findings: No  skull fracture is noted.  Paranasal sinuses and mastoid air cells are unremarkable.  No intracranial hemorrhage, mass effect or midline shift.  No acute infarction.  No mass lesion is noted on this unenhanced scan.  Ventricular size is stable from prior exam.  IMPRESSION: No acute intracranial abnormality.  No significant change.  CT cervical spine without IV contrast:  Findings:  Axial images of the cervical spine shows no acute fracture or subluxation. Computer processed images shows no acute fracture or subluxation. Alignment, disc spaces and vertebral height are preserved.  No prevertebral soft tissue swelling.  Cervical airway is patent.  There is no pneumothorax in visualized lung apices.  Impression: 1.  No acute fracture or subluxation.  Original Report Authenticated By: Natasha Mead, M.D.   Ct Cervical Spine Wo Contrast  10/03/2011  *RADIOLOGY REPORT*  Clinical Data: Altered mental status, status post fall  CT HEAD WITHOUT CONTRAST,CT CERVICAL SPINE WITHOUT CONTRAST  Technique:  Contiguous axial images were obtained from the base of the skull through the vertex without contrast.,Technique: Multidetector CT imaging of the cervical spine was performed. Multiplanar CT image reconstructions were also generated.  Comparison: 03/13/2010  Findings: No skull fracture is noted.  Paranasal sinuses and mastoid air cells are unremarkable.  No intracranial hemorrhage, mass effect or midline shift.  No acute infarction.  No mass lesion is noted on this unenhanced scan.  Ventricular size is stable from prior exam.  IMPRESSION: No acute intracranial abnormality.  No significant change.  CT cervical spine without IV contrast:  Findings:  Axial images of the cervical spine shows no acute fracture or subluxation. Computer processed images shows no acute fracture or subluxation.  Alignment, disc spaces and vertebral height are preserved.  No prevertebral soft tissue swelling.  Cervical airway is patent.  There is no pneumothorax in visualized lung apices.  Impression: 1.  No acute fracture or subluxation.  Original Report Authenticated By: Natasha Mead, M.D.     MDM  Patient may have suffered from seizure. Spoke with hospital pharmacist he states amitriptyline can lower seizure threshold   To rule out posterior circulation stroke. Patient also may have suffered from seizure.. Will contact her neurologist Dr.Wiserman at [facility] at [phone] to ensure followup Diagnosis #1 altered mental status-resolved #2 headache #3 vertigo     Doug Sou, MD 10/03/11 1256  Doug Sou, MD 10/03/11 1723

## 2011-10-05 NOTE — ED Provider Notes (Signed)
Medical screening examination/treatment/procedure(s) were conducted as a shared visit with non-physician practitioner(s) and myself.  I personally evaluated the patient during the encounter  Doug Sou, MD 10/05/11 [phone]

## 2013-02-12 ENCOUNTER — Emergency Department (HOSPITAL_COMMUNITY)
Admission: EM | Admit: 2013-02-12 | Discharge: 2013-02-12 | Disposition: A | Discharge status: Home / Self Care | Payer: Self-pay | Attending: Emergency Medicine | Admitting: Emergency Medicine

## 2013-02-12 DIAGNOSIS — Z91040 Latex allergy status: Secondary | ICD-10-CM | POA: Insufficient documentation

## 2013-02-12 DIAGNOSIS — Z79899 Other long term (current) drug therapy: Secondary | ICD-10-CM | POA: Insufficient documentation

## 2013-02-12 DIAGNOSIS — R040 Epistaxis: Secondary | ICD-10-CM | POA: Insufficient documentation

## 2013-02-12 DIAGNOSIS — Z8669 Personal history of other diseases of the nervous system and sense organs: Secondary | ICD-10-CM | POA: Insufficient documentation

## 2013-02-12 DIAGNOSIS — R5381 Other malaise: Secondary | ICD-10-CM | POA: Insufficient documentation

## 2013-02-12 DIAGNOSIS — R42 Dizziness and giddiness: Secondary | ICD-10-CM | POA: Insufficient documentation

## 2013-02-12 DIAGNOSIS — F329 Major depressive disorder, single episode, unspecified: Secondary | ICD-10-CM | POA: Insufficient documentation

## 2013-02-12 DIAGNOSIS — F3289 Other specified depressive episodes: Secondary | ICD-10-CM | POA: Insufficient documentation

## 2013-02-12 MED ORDER — OXYMETAZOLINE HCL 0.05 % NA SOLN
1.0000 | Freq: Once | NASAL | Status: AC
  Administered 2013-02-12: 1 via NASAL
  Filled 2013-02-12: qty 15

## 2013-02-12 NOTE — ED Provider Notes (Signed)
CSN: [id number]     Arrival date & time 02/12/13  1649 History  This chart was scribed for non-physician practitioner Dierdre Forth, PA-C, working with Enid Skeens, MD by Dorothey Baseman, ED Scribe. This patient was seen in room TR08C/TR08C and the patient's care was started at 7:51 PM.     Chief Complaint  Patient presents with  . Epistaxis   Patient is a 32 y.o. female presenting with nosebleeds. The history is provided by the patient and medical records. No language interpreter was used.  Epistaxis Location:  L nare Severity:  Moderate Timing:  Intermittent Chronicity:  Recurrent Context: not anticoagulants   Relieved by:  Applying pressure Ineffective treatments:  Ice Associated symptoms: no dizziness and no headaches    HPI Comments: [name] is a 32 y.o. female who presents to the Emergency Department complaining of intermittent episodes of epistaxis from the left nare onset 1 week ago, 3-4 episodes per day that last approximately 30 minutes. Patient reports that her last episode was today, which began around 4:00 (4 hours ago) and she states that her job advised her to come here. She reports flushing the left nostril with saline and pinching the nose at home to treat prior episodes. Patient also reports applying ice to the area without relief. Patient reports associated weakness and dizziness only during the episodes which resolves when the bleeding stops. She reports that she has had a cauterization in the left nare in the past (performed at [facility]). She denies anticoagulant or allergy medication use. Patient denies any other pertinent medical history.  Patient denies anticoagulation use.  He reports recent URI symptoms with rhinorrhea and sinus congestion approximately one week ago.  Past Medical History  Diagnosis Date  . Nerve damage   . Depression    Past Surgical History  Procedure Laterality Date  . Cholecystectomy    . Cesarean section     No family  history on file. History  Substance Use Topics  . Smoking status: Never Smoker   . Smokeless tobacco: Not on file  . Alcohol Use: Yes     Comment: social   OB History   Grav Para Term Preterm Abortions TAB SAB Ect Mult Living                 Review of Systems  HENT: Positive for nosebleeds, rhinorrhea and sinus pressure.   Cardiovascular: Negative for chest pain.  Gastrointestinal: Negative for nausea, vomiting and abdominal pain.  Musculoskeletal: Negative for back pain.  Skin: Negative for color change and pallor.  Allergic/Immunologic: Negative for immunocompromised state.  Neurological: Positive for weakness and light-headedness. Negative for dizziness and headaches.  Hematological: Does not bruise/bleed easily.  Psychiatric/Behavioral: The patient is not nervous/anxious.     Allergies  Amoxicillin-pot clavulanate; Vigamox; Latex; and Dilaudid  Home Medications   Current Outpatient Rx  Name  Route  Sig  Dispense  Refill  . amitriptyline (ELAVIL) 150 MG tablet   Oral   Take 150 mg by mouth at bedtime.           . pregabalin (LYRICA) 100 MG capsule   Oral   Take 100 mg by mouth 3 (three) times daily.         . promethazine (PHENERGAN) 12.5 MG tablet   Oral   Take 12.5 mg by mouth every 8 (eight) hours as needed for nausea.         Marland Kitchen topiramate (TOPAMAX) 25 MG capsule   Oral  Take 25 mg by mouth 4 (four) times daily.          Triage Vitals: BP 116/75  Pulse 100  Temp(Src) 98.8 F (37.1 C) (Oral)  Resp 16  Wt 262 lb 5 oz (118.984 kg)  SpO2 98%  Physical Exam  Nursing note and vitals reviewed. Constitutional: She appears well-developed and well-nourished. No distress.  Awake, alert, nontoxic appearance  HENT:  Head: Normocephalic and atraumatic.  Right Ear: Hearing, tympanic membrane, external ear and ear canal normal.  Left Ear: Hearing, tympanic membrane, external ear and ear canal normal.  Nose: No mucosal edema, rhinorrhea, nose  lacerations, sinus tenderness, nasal deformity, septal deviation or nasal septal hematoma. No epistaxis.  No foreign bodies. Right sinus exhibits no maxillary sinus tenderness and no frontal sinus tenderness. Left sinus exhibits no maxillary sinus tenderness and no frontal sinus tenderness.  Mouth/Throat: Uvula is midline, oropharynx is clear and moist and mucous membranes are normal. Mucous membranes are not dry. No uvula swelling. No oropharyngeal exudate, posterior oropharyngeal edema, posterior oropharyngeal erythema or tonsillar abscesses.  Left nare has some raw spots on the turbinets, but no apparent lesions or vessels are noted. Right nare is clear.  Eyes: Conjunctivae are normal. No scleral icterus.  Neck: Normal range of motion. Neck supple.  Cardiovascular: Normal rate, regular rhythm, normal heart sounds and intact distal pulses.   Pulmonary/Chest: Effort normal and breath sounds normal. No respiratory distress. She has no wheezes.  Abdominal: Soft. Bowel sounds are normal. She exhibits no mass. There is no tenderness. There is no rebound and no guarding.  Musculoskeletal: Normal range of motion. She exhibits no edema.  Neurological: She is alert.  Speech is clear and goal oriented Moves extremities without ataxia  Skin: Skin is warm and dry. She is not diaphoretic.  Psychiatric: She has a normal mood and affect.    ED Course  Procedures (including critical care time)  DIAGNOSTIC STUDIES: Oxygen Saturation is 98% on room air, normal by my interpretation.    COORDINATION OF CARE: 7:56 PM- Advised patient to use warm saline flushes, ice, and pressure at home to treat episodes. Will discharge patient with Afrin. Advised patient to follow up with an ENT specialist. Discussed treatment plan with patient at bedside and patient verbalized agreement.     Labs Review Labs Reviewed - No data to display Imaging Review No results found.  EKG Interpretation   None       MDM    1. Epistaxis, recurrent    [name] presents with recurrent epistaxis resolved prior to arrival here in the emergency department. Exam without current epistaxis. No blood clot noted in either nare. No vessel for which cautery would be useful noted exam.  She endorses lightheadedness during bleeding but not at other times. Patient has had no syncopal episodes. She is not taking an anticoagulant and these are not associated with headaches. Patient without hypertension here. No pallor of the mucous membranes to indicate anemia. She is also without hypotension here to indicate hypovolemia.  Patient to be discharged home with Afrin and recommend ice and pressure for recurrent nosebleeds. The patient will followup with [facility].  It has been determined that no acute conditions requiring further emergency intervention are present at this time. The patient/guardian have been advised of the diagnosis and plan. We have discussed signs and symptoms that warrant return to the ED, such as changes or worsening in symptoms.   Vital signs are stable at discharge.  BP 116/75  Pulse 100  Temp(Src) 98.8 F (37.1 C) (Oral)  Resp 16  Wt 262 lb 5 oz (118.984 kg)  SpO2 98%  Patient/guardian has voiced understanding and agreed to follow-up with the PCP or specialist.    I personally performed the services described in this documentation, which was scribed in my presence. The recorded information has been reviewed and is accurate.   Dahlia Client Mirakle Tomlin, PA-C 02/12/13 2010

## 2013-02-12 NOTE — ED Notes (Signed)
Presents with intermittent nose bleeds for one week, last night lasted 30 minutes and bled heavily, "most severe one I had" today began bleeding at 16:15 today and was able to control bleeding. Bleeding from left nostril always and occurs 3-4 times per day. Not currently bleedinga t this time. Denies use of blood thinners. Reports feeling weak and dizzy.

## 2013-02-13 NOTE — ED Provider Notes (Signed)
Medical screening examination/treatment/procedure(s) were performed by non-physician practitioner and as supervising physician I was immediately available for consultation/collaboration.  EKG Interpretation   None         Lagina Reader M Stoy Fenn, MD 02/13/13 0123 

## 2014-09-16 ENCOUNTER — Emergency Department (HOSPITAL_BASED_OUTPATIENT_CLINIC_OR_DEPARTMENT_OTHER): Payer: Self-pay

## 2014-09-16 ENCOUNTER — Encounter (HOSPITAL_BASED_OUTPATIENT_CLINIC_OR_DEPARTMENT_OTHER): Payer: Self-pay

## 2014-09-16 ENCOUNTER — Emergency Department (HOSPITAL_BASED_OUTPATIENT_CLINIC_OR_DEPARTMENT_OTHER)
Admission: EM | Admit: 2014-09-16 | Discharge: 2014-09-16 | Disposition: A | Discharge status: Home / Self Care | Payer: Self-pay | Attending: Emergency Medicine | Admitting: Emergency Medicine

## 2014-09-16 DIAGNOSIS — Z792 Long term (current) use of antibiotics: Secondary | ICD-10-CM | POA: Insufficient documentation

## 2014-09-16 DIAGNOSIS — Z91040 Latex allergy status: Secondary | ICD-10-CM | POA: Insufficient documentation

## 2014-09-16 DIAGNOSIS — Z3202 Encounter for pregnancy test, result negative: Secondary | ICD-10-CM | POA: Insufficient documentation

## 2014-09-16 DIAGNOSIS — Z880 Allergy status to penicillin: Secondary | ICD-10-CM | POA: Insufficient documentation

## 2014-09-16 DIAGNOSIS — Z9049 Acquired absence of other specified parts of digestive tract: Secondary | ICD-10-CM | POA: Insufficient documentation

## 2014-09-16 DIAGNOSIS — K5732 Diverticulitis of large intestine without perforation or abscess without bleeding: Secondary | ICD-10-CM

## 2014-09-16 DIAGNOSIS — F329 Major depressive disorder, single episode, unspecified: Secondary | ICD-10-CM | POA: Insufficient documentation

## 2014-09-16 LAB — WET PREP, GENITAL
Trich, Wet Prep: NONE SEEN
Yeast Wet Prep HPF POC: NONE SEEN

## 2014-09-16 LAB — CBC WITH DIFFERENTIAL/PLATELET
Basophils Absolute: 0 10*3/uL (ref 0.0–0.1)
Basophils Relative: 0 % (ref 0–1)
EOS ABS: 0.2 10*3/uL (ref 0.0–0.7)
Eosinophils Relative: 1 % (ref 0–5)
HEMATOCRIT: 39.6 % (ref 36.0–46.0)
HEMOGLOBIN: 12.9 g/dL (ref 12.0–15.0)
Lymphocytes Relative: 27 % (ref 12–46)
Lymphs Abs: 2.9 10*3/uL (ref 0.7–4.0)
MCH: 29.3 pg (ref 26.0–34.0)
MCHC: 32.6 g/dL (ref 30.0–36.0)
MCV: 90 fL (ref 78.0–100.0)
MONO ABS: 0.7 10*3/uL (ref 0.1–1.0)
MONOS PCT: 6 % (ref 3–12)
NEUTROS PCT: 66 % (ref 43–77)
Neutro Abs: 7.2 10*3/uL (ref 1.7–7.7)
Platelets: 251 10*3/uL (ref 150–400)
RBC: 4.4 MIL/uL (ref 3.87–5.11)
RDW: 13.2 % (ref 11.5–15.5)
WBC: 10.9 10*3/uL — ABNORMAL HIGH (ref 4.0–10.5)

## 2014-09-16 LAB — URINALYSIS, ROUTINE W REFLEX MICROSCOPIC
Bilirubin Urine: NEGATIVE
GLUCOSE, UA: NEGATIVE mg/dL
Hgb urine dipstick: NEGATIVE
Ketones, ur: NEGATIVE mg/dL
Nitrite: POSITIVE — AB
PH: 6.5 (ref 5.0–8.0)
Protein, ur: NEGATIVE mg/dL
Specific Gravity, Urine: 1.015 (ref 1.005–1.030)
Urobilinogen, UA: 0.2 mg/dL (ref 0.0–1.0)

## 2014-09-16 LAB — PREGNANCY, URINE: PREG TEST UR: NEGATIVE

## 2014-09-16 LAB — BASIC METABOLIC PANEL
Anion gap: 9 (ref 5–15)
BUN: 10 mg/dL (ref 6–20)
CALCIUM: 8.8 mg/dL — AB (ref 8.9–10.3)
CO2: 23 mmol/L (ref 22–32)
CREATININE: 0.76 mg/dL (ref 0.44–1.00)
Chloride: 108 mmol/L (ref 101–111)
GFR calc Af Amer: >60 mL/min (ref >60)
GLUCOSE: 111 mg/dL — AB (ref 65–99)
Potassium: 3.5 mmol/L (ref 3.5–5.1)
SODIUM: 140 mmol/L (ref 135–145)

## 2014-09-16 LAB — URINE MICROSCOPIC-ADD ON

## 2014-09-16 MED ORDER — MORPHINE SULFATE 4 MG/ML IJ SOLN
8.0000 mg | Freq: Once | INTRAMUSCULAR | Status: AC
  Administered 2014-09-16: 8 mg via INTRAVENOUS
  Filled 2014-09-16: qty 2

## 2014-09-16 MED ORDER — SODIUM CHLORIDE 0.9 % IV BOLUS (SEPSIS)
1000.0000 mL | Freq: Once | INTRAVENOUS | Status: AC
  Administered 2014-09-16: 1000 mL via INTRAVENOUS

## 2014-09-16 MED ORDER — IBUPROFEN 600 MG PO TABS: 600.0000 mg | ORAL_TABLET | Freq: Three times a day (TID) | ORAL | Status: DC | PRN

## 2014-09-16 MED ORDER — SULFAMETHOXAZOLE-TRIMETHOPRIM 800-160 MG PO TABS: 1.0000 | ORAL_TABLET | Freq: Two times a day (BID) | ORAL | Status: AC

## 2014-09-16 MED ORDER — METRONIDAZOLE 500 MG PO TABS
500.0000 mg | ORAL_TABLET | Freq: Once | ORAL | Status: AC
  Administered 2014-09-16: 500 mg via ORAL
  Filled 2014-09-16: qty 1

## 2014-09-16 MED ORDER — SULFAMETHOXAZOLE-TRIMETHOPRIM 800-160 MG PO TABS
1.0000 | ORAL_TABLET | Freq: Once | ORAL | Status: AC
  Administered 2014-09-16: 1 via ORAL
  Filled 2014-09-16: qty 1

## 2014-09-16 MED ORDER — METRONIDAZOLE 500 MG PO TABS: 500.0000 mg | ORAL_TABLET | Freq: Two times a day (BID) | ORAL | Status: DC

## 2014-09-16 MED ORDER — HYDROCODONE-ACETAMINOPHEN 5-325 MG PO TABS: 1.0000 | ORAL_TABLET | ORAL | Status: DC | PRN

## 2014-09-16 MED ORDER — FENTANYL CITRATE (PF) 100 MCG/2ML IJ SOLN
100.0000 ug | Freq: Once | INTRAMUSCULAR | Status: AC
  Administered 2014-09-16: 100 ug via INTRAVENOUS
  Filled 2014-09-16: qty 2

## 2014-09-16 MED ORDER — ONDANSETRON HCL 4 MG/2ML IJ SOLN
4.0000 mg | Freq: Once | INTRAMUSCULAR | Status: AC
  Administered 2014-09-16: 4 mg via INTRAVENOUS
  Filled 2014-09-16: qty 2

## 2014-09-16 MED ORDER — IOHEXOL 300 MG/ML  SOLN
100.0000 mL | Freq: Once | INTRAMUSCULAR | Status: AC | PRN
  Administered 2014-09-16: 100 mL via INTRAVENOUS

## 2014-09-16 MED ORDER — PROMETHAZINE HCL 25 MG PO TABS: 25.0000 mg | ORAL_TABLET | Freq: Four times a day (QID) | ORAL | Status: DC | PRN

## 2014-09-16 NOTE — ED Notes (Signed)
Dr. campos at bedside

## 2014-09-16 NOTE — Discharge Instructions (Signed)

## 2014-09-16 NOTE — ED Notes (Signed)
MD at bedside. 

## 2014-09-16 NOTE — ED Notes (Signed)
Patient returns from CT

## 2014-09-16 NOTE — ED Notes (Signed)
Patient transported to CT 

## 2014-09-16 NOTE — ED Provider Notes (Addendum)
CSN: [id number]     Arrival date & time 09/16/14  0535 History   First MD Initiated Contact with Patient 09/16/14 0602     Chief Complaint  Patient presents with  . Abdominal Pain     (Consider location/radiation/quality/duration/timing/severity/associated sxs/prior Treatment) HPI  This is a 34 year old female with a one-week history of nausea and vomiting. She is here this morning due to left lower quadrant pain that began yesterday afternoon. The pain is, on gradually and is now an 8 or 9 out of 10. She states it feels like someone has punched her in the stomach. Pain is worse with movement or palpation. She denies fever but has had chills. She denies diarrhea, dysuria, hematuria, vaginal bleeding or vaginal discharge.  Past Medical History  Diagnosis Date  . Nerve damage   . Depression    Past Surgical History  Procedure Laterality Date  . Cholecystectomy    . Cesarean section    . Sacral nerve stimulator placement     No family history on file. History  Substance Use Topics  . Smoking status: Never Smoker   . Smokeless tobacco: Not on file  . Alcohol Use: Yes     Comment: social   OB History    No data available     Review of Systems  All other systems reviewed and are negative.   Allergies  Amoxicillin-pot clavulanate; Vigamox; Latex; and Dilaudid  Home Medications   Prior to Admission medications   Medication Sig Start Date End Date Taking? Authorizing Provider  FLUoxetine (PROZAC) 20 MG capsule Take 20 mg by mouth daily.   Yes Historical Provider, MD  Vitamin D, Ergocalciferol, (DRISDOL) 50000 UNITS CAPS capsule Take 50,000 Units by mouth every 7 (seven) days.   Yes Historical Provider, MD  amitriptyline (ELAVIL) 150 MG tablet Take 150 mg by mouth at bedtime.      Historical Provider, MD  HYDROcodone-acetaminophen (NORCO/VICODIN) 5-325 MG per tablet Take 1 tablet by mouth every 4 (four) hours as needed for moderate pain. 09/16/14   Azalia Bilis, MD  ibuprofen  (ADVIL,MOTRIN) 600 MG tablet Take 1 tablet (600 mg total) by mouth every 8 (eight) hours as needed. 09/16/14   Azalia Bilis, MD  metroNIDAZOLE (FLAGYL) 500 MG tablet Take 1 tablet (500 mg total) by mouth 2 (two) times daily. 09/16/14   Azalia Bilis, MD  pregabalin (LYRICA) 100 MG capsule Take 100 mg by mouth 3 (three) times daily.    Historical Provider, MD  promethazine (PHENERGAN) 25 MG tablet Take 1 tablet (25 mg total) by mouth every 6 (six) hours as needed for nausea or vomiting. 09/16/14   Azalia Bilis, MD  sulfamethoxazole-trimethoprim (BACTRIM DS,SEPTRA DS) 800-160 MG per tablet Take 1 tablet by mouth 2 (two) times daily. 09/16/14 09/23/14  Azalia Bilis, MD  topiramate (TOPAMAX) 25 MG capsule Take 25 mg by mouth 4 (four) times daily.    Historical Provider, MD   BP 132/82 mmHg  Pulse 92  Temp(Src) 98 F (36.7 C) (Oral)  Resp 18  Ht  (1.6 m)  Wt 260 lb (117.935 kg)  BMI 46.07 kg/m2  SpO2 99%  LMP 08/25/2014   Physical Exam  General: Well-developed, obese female in no acute distress; appearance consistent with age of record HENT: normocephalic; atraumatic Eyes: pupils equal, round and reactive to light; extraocular muscles intact Neck: supple Heart: regular rate and rhythm Lungs: clear to auscultation bilaterally Abdomen: soft; obese; left lower quadrant tenderness; bowel sounds present GU: No CVA tenderness; normal  external genitalia; physiologic appearing vaginal discharge; no vaginal bleeding; no cervical motion tenderness; left lower quadrant tenderness that is difficult to localize to adnexa due to body habitus Extremities: No deformity; full range of motion; pulses normal Neurologic: Awake, alert and oriented; motor function intact in all extremities and symmetric; no facial droop Skin: Warm and dry Psychiatric: Normal mood and affect    ED Course  Procedures (including critical care time)   MDM   Nursing notes and vitals signs, including pulse oximetry,  reviewed.  Summary of this visit's results, reviewed by myself:  Labs:  Results for orders placed or performed during the hospital encounter of 09/16/14 (from the past 24 hour(s))  Urinalysis, Routine w reflex microscopic     Status: Abnormal   Collection Time: 09/16/14  5:57 AM  Result Value Ref Range   Color, Urine YELLOW YELLOW   APPearance CLOUDY (A) CLEAR   Specific Gravity, Urine 1.015 1.005 - 1.030   pH 6.5 5.0 - 8.0   Glucose, UA NEGATIVE NEGATIVE mg/dL   Hgb urine dipstick NEGATIVE NEGATIVE   Bilirubin Urine NEGATIVE NEGATIVE   Ketones, ur NEGATIVE NEGATIVE mg/dL   Protein, ur NEGATIVE NEGATIVE mg/dL   Urobilinogen, UA 0.2 0.0 - 1.0 mg/dL   Nitrite POSITIVE (A) NEGATIVE   Leukocytes, UA SMALL (A) NEGATIVE  Pregnancy, urine     Status: None   Collection Time: 09/16/14  5:57 AM  Result Value Ref Range   Preg Test, Ur NEGATIVE NEGATIVE  Urine microscopic-add on     Status: Abnormal   Collection Time: 09/16/14  5:57 AM  Result Value Ref Range   Squamous Epithelial / LPF FEW (A) RARE   WBC, UA 7-10 <3 WBC/hpf   RBC / HPF 0-2 <3 RBC/hpf   Bacteria, UA MANY (A) RARE  Basic metabolic panel     Status: Abnormal   Collection Time: 09/16/14  6:50 AM  Result Value Ref Range   Sodium 140 135 - 145 mmol/L   Potassium 3.5 3.5 - 5.1 mmol/L   Chloride 108 101 - 111 mmol/L   CO2 23 22 - 32 mmol/L   Glucose, Bld 111 (H) 65 - 99 mg/dL   BUN 10 6 - 20 mg/dL   Creatinine, Ser 1.610.76 0.44 - 1.00 mg/dL   Calcium 8.8 (L) 8.9 - 10.3 mg/dL   GFR calc non Af Amer >60 >60 mL/min   GFR calc Af Amer >60 >60 mL/min   Anion gap 9 5 - 15  CBC with Differential/Platelet     Status: Abnormal   Collection Time: 09/16/14  6:50 AM  Result Value Ref Range   WBC 10.9 (H) 4.0 - 10.5 K/uL   RBC 4.40 3.87 - 5.11 MIL/uL   Hemoglobin 12.9 12.0 - 15.0 g/dL   HCT 09.639.6 04.536.0 - 40.946.0 %   MCV 90.0 78.0 - 100.0 fL   MCH 29.3 26.0 - 34.0 pg   MCHC 32.6 30.0 - 36.0 g/dL   RDW 81.113.2 91.411.5 - 78.215.5 %   Platelets  251 150 - 400 K/uL   Neutrophils Relative % 66 43 - 77 %   Neutro Abs 7.2 1.7 - 7.7 K/uL   Lymphocytes Relative 27 12 - 46 %   Lymphs Abs 2.9 0.7 - 4.0 K/uL   Monocytes Relative 6 3 - 12 %   Monocytes Absolute 0.7 0.1 - 1.0 K/uL   Eosinophils Relative 1 0 - 5 %   Eosinophils Absolute 0.2 0.0 - 0.7 K/uL   Basophils  Relative 0 0 - 1 %   Basophils Absolute 0.0 0.0 - 0.1 K/uL  Wet prep, genital     Status: Abnormal   Collection Time: 09/16/14  6:59 AM  Result Value Ref Range   Yeast Wet Prep HPF POC NONE SEEN NONE SEEN   Trich, Wet Prep NONE SEEN NONE SEEN   Clue Cells Wet Prep HPF POC MANY (A) NONE SEEN   WBC, Wet Prep HPF POC FEW (A) NONE SEEN    Imaging Studies: Ct Abdomen Pelvis W Contrast  09/16/2014   CLINICAL DATA:  Left lower quadrant abdominal pain with nausea and vomiting for 5 days. History of appendectomy and cholecystectomy. Initial encounter.  EXAM: CT ABDOMEN AND PELVIS WITH CONTRAST  TECHNIQUE: Multidetector CT imaging of the abdomen and pelvis was performed using the standard protocol following bolus administration of intravenous contrast.  CONTRAST:  OMNIPAQUE IOHEXOL 300 MG/ML  SOLN  COMPARISON:  Pelvic ultrasound 12/17/2009. Abdominal pelvic CT 07/26/2009.  FINDINGS: Lower chest: Minimal bibasilar atelectasis. No significant pleural or pericardial effusion. There is a small hiatal hernia.  Hepatobiliary: The hepatic density is diffusely decreased consistent with steatosis. No focal hepatic lesions identified. No significant biliary dilatation status post cholecystectomy.  Pancreas: Unremarkable. No pancreatic ductal dilatation or surrounding inflammatory changes.  Spleen: Normal in size without focal abnormality.  Adrenals/Urinary Tract: Both adrenal glands appear normal.The kidneys appear normal without evidence of urinary tract calculus, suspicious lesion or hydronephrosis. No bladder abnormalities are seen.  Stomach/Bowel: No enteric contrast was administered. The  stomach, small bowel and proximal colon demonstrate no significant findings. The appendix is not well visualized, but there is no pericecal inflammation.At the junction of the descending and sigmoid colon, there is mild colonic wall thickening and adjacent soft tissue stranding most consistent with diverticulitis. There is no extraluminal fluid collection or evidence of bowel obstruction.  Vascular/Lymphatic: There are no enlarged abdominal or pelvic lymph nodes. No significant vascular findings are present.  Reproductive: Bicornuate/septate uterus again noted. The right ovary appears normal. The left ovary is prominent, measuring 3.7 cm on image 72. The soft tissue stranding described above is lateral to the left ovary and attributed to the adjacent colon.  Other: Interval umbilical hernia repair. There is a broad-based residual hernia containing only fat. The overlying subcutaneous fat demonstrates increased density. Thoracic spinal stimulator noted.  Musculoskeletal: No acute or significant osseous findings.  IMPRESSION: 1. Left lower quadrant inflammatory process appears associated with the colon at the descending/sigmoid junction, most consistent with diverticulitis or appendagitis epiploica. 2. Although the adjacent left ovary is prominent, the inflammatory changes appear lateral to it, and a primary adnexal process such as ovarian torsion is not favored. 3. No evidence of bowel obstruction or perforation. 4. Bicornuate/septate uterus as previously noted. 5. Hepatic steatosis.   Electronically Signed   By: Carey Bullocks M.D.   On: 09/16/2014 08:12    7:08 AM Pelvic ultrasound pending. Dr. Patria Mane will follow up on results.  9:59 AM Results and Dr. Patria Mane' note reviewed.  Paula Libra, MD 09/16/14 1610  Paula Libra, MD 09/16/14 1001

## 2014-09-16 NOTE — ED Notes (Signed)
Pt c/o vomiting x1 wk; LLQ pain since yesterday;

## 2014-09-16 NOTE — ED Provider Notes (Signed)
8:23 AM Care from Dr Read DriversMolpus. Pt with left sided diverticultis on CT scan. No pelvic pathology. Pain control. Questionable urine will be sent for culture. Pt will be placed on flagyl and bactrim for diverticulitis which will also add urinary coverage. Pt with allergy to fluoroquinolones.  Patient be sent home with pain medications well.  Patient understands return the emergency department for new or worsening symptoms  Ct Abdomen Pelvis W Contrast  09/16/2014   CLINICAL DATA:  Left lower quadrant abdominal pain with nausea and vomiting for 5 days. History of appendectomy and cholecystectomy. Initial encounter.  EXAM: CT ABDOMEN AND PELVIS WITH CONTRAST  TECHNIQUE: Multidetector CT imaging of the abdomen and pelvis was performed using the standard protocol following bolus administration of intravenous contrast.  CONTRAST:  100mL OMNIPAQUE IOHEXOL 300 MG/ML  SOLN  COMPARISON:  Pelvic ultrasound 12/17/2009. Abdominal pelvic CT 07/26/2009.  FINDINGS: Lower chest: Minimal bibasilar atelectasis. No significant pleural or pericardial effusion. There is a small hiatal hernia.  Hepatobiliary: The hepatic density is diffusely decreased consistent with steatosis. No focal hepatic lesions identified. No significant biliary dilatation status post cholecystectomy.  Pancreas: Unremarkable. No pancreatic ductal dilatation or surrounding inflammatory changes.  Spleen: Normal in size without focal abnormality.  Adrenals/Urinary Tract: Both adrenal glands appear normal.The kidneys appear normal without evidence of urinary tract calculus, suspicious lesion or hydronephrosis. No bladder abnormalities are seen.  Stomach/Bowel: No enteric contrast was administered. The stomach, small bowel and proximal colon demonstrate no significant findings. The appendix is not well visualized, but there is no pericecal inflammation.At the junction of the descending and sigmoid colon, there is mild colonic wall thickening and adjacent soft tissue  stranding most consistent with diverticulitis. There is no extraluminal fluid collection or evidence of bowel obstruction.  Vascular/Lymphatic: There are no enlarged abdominal or pelvic lymph nodes. No significant vascular findings are present.  Reproductive: Bicornuate/septate uterus again noted. The right ovary appears normal. The left ovary is prominent, measuring 3.7 cm on image 72. The soft tissue stranding described above is lateral to the left ovary and attributed to the adjacent colon.  Other: Interval umbilical hernia repair. There is a broad-based residual hernia containing only fat. The overlying subcutaneous fat demonstrates increased density. Thoracic spinal stimulator noted.  Musculoskeletal: No acute or significant osseous findings.  IMPRESSION: 1. Left lower quadrant inflammatory process appears associated with the colon at the descending/sigmoid junction, most consistent with diverticulitis or appendagitis epiploica. 2. Although the adjacent left ovary is prominent, the inflammatory changes appear lateral to it, and a primary adnexal process such as ovarian torsion is not favored. 3. No evidence of bowel obstruction or perforation. 4. Bicornuate/septate uterus as previously noted. 5. Hepatic steatosis.   Electronically Signed   By: Carey BullocksWilliam  Veazey M.D.   On: 09/16/2014 08:12   I personally reviewed the imaging tests through PACS system I reviewed available ER/hospitalization records through the EMR  Results for orders placed or performed during the hospital encounter of 09/16/14  Wet prep, genital  Result Value Ref Range   Yeast Wet Prep HPF POC NONE SEEN NONE SEEN   Trich, Wet Prep NONE SEEN NONE SEEN   Clue Cells Wet Prep HPF POC MANY (A) NONE SEEN   WBC, Wet Prep HPF POC FEW (A) NONE SEEN  Urinalysis, Routine w reflex microscopic  Result Value Ref Range   Color, Urine YELLOW YELLOW   APPearance CLOUDY (A) CLEAR   Specific Gravity, Urine 1.015 1.005 - 1.030   pH 6.5 5.0 - 8.0  Glucose, UA NEGATIVE NEGATIVE mg/dL   Hgb urine dipstick NEGATIVE NEGATIVE   Bilirubin Urine NEGATIVE NEGATIVE   Ketones, ur NEGATIVE NEGATIVE mg/dL   Protein, ur NEGATIVE NEGATIVE mg/dL   Urobilinogen, UA 0.2 0.0 - 1.0 mg/dL   Nitrite POSITIVE (A) NEGATIVE   Leukocytes, UA SMALL (A) NEGATIVE  Pregnancy, urine  Result Value Ref Range   Preg Test, Ur NEGATIVE NEGATIVE  Urine microscopic-add on  Result Value Ref Range   Squamous Epithelial / LPF FEW (A) RARE   WBC, UA 7-10 <3 WBC/hpf   RBC / HPF 0-2 <3 RBC/hpf   Bacteria, UA MANY (A) RARE  Basic metabolic panel  Result Value Ref Range   Sodium 140 135 - 145 mmol/L   Potassium 3.5 3.5 - 5.1 mmol/L   Chloride 108 101 - 111 mmol/L   CO2 23 22 - 32 mmol/L   Glucose, Bld 111 (H) 65 - 99 mg/dL   BUN 10 6 - 20 mg/dL   Creatinine, Ser 1.61 0.44 - 1.00 mg/dL   Calcium 8.8 (L) 8.9 - 10.3 mg/dL   GFR calc non Af Amer >60 >60 mL/min   GFR calc Af Amer >60 >60 mL/min   Anion gap 9 5 - 15  CBC with Differential/Platelet  Result Value Ref Range   WBC 10.9 (H) 4.0 - 10.5 K/uL   RBC 4.40 3.87 - 5.11 MIL/uL   Hemoglobin 12.9 12.0 - 15.0 g/dL   HCT 09.6 04.5 - 40.9 %   MCV 90.0 78.0 - 100.0 fL   MCH 29.3 26.0 - 34.0 pg   MCHC 32.6 30.0 - 36.0 g/dL   RDW 81.1 91.4 - 78.2 %   Platelets 251 150 - 400 K/uL   Neutrophils Relative % 66 43 - 77 %   Neutro Abs 7.2 1.7 - 7.7 K/uL   Lymphocytes Relative 27 12 - 46 %   Lymphs Abs 2.9 0.7 - 4.0 K/uL   Monocytes Relative 6 3 - 12 %   Monocytes Absolute 0.7 0.1 - 1.0 K/uL   Eosinophils Relative 1 0 - 5 %   Eosinophils Absolute 0.2 0.0 - 0.7 K/uL   Basophils Relative 0 0 - 1 %   Basophils Absolute 0.0 0.0 - 0.1 K/uL     Azalia Bilis, MD 09/16/14 [phone]

## 2014-09-17 LAB — GC/CHLAMYDIA PROBE AMP (~~LOC~~) NOT AT ARMC
Chlamydia: NEGATIVE
Neisseria Gonorrhea: NEGATIVE

## 2014-09-18 ENCOUNTER — Telehealth (HOSPITAL_BASED_OUTPATIENT_CLINIC_OR_DEPARTMENT_OTHER): Payer: Self-pay | Admitting: Emergency Medicine

## 2014-09-18 LAB — URINE CULTURE

## 2014-09-18 NOTE — Telephone Encounter (Signed)
Post ED Visit - Positive Culture Follow-up  Culture report reviewed by antimicrobial stewardship pharmacist: []  Wes Dulaney, Pharm.D., BCPS []  Celedonio MiyamotoJeremy Frens, Pharm.D., BCPS [x]  Georgina PillionElizabeth Martin, Pharm.D., BCPS []  [place] Lake[place]h Pham, [place]arm[place]D., BCPS, AAHIVP []  Estella HuskMichelle Turner, Pharm.D., BCPS, AAHIVP []  Elder CyphersLorie Poole, [address]Pharm.D., BCPS  Positive urine culture [name] Treated with flagyl, bactrim, organism sensitive to the same and no further patient follow-up is required at this time.  Berle MullMiller, Coltin Casher 09/18/2014, 1:27 PM

## 2014-09-25 ENCOUNTER — Emergency Department (HOSPITAL_BASED_OUTPATIENT_CLINIC_OR_DEPARTMENT_OTHER)
Admission: EM | Admit: 2014-09-25 | Discharge: 2014-09-25 | Disposition: A | Discharge status: Home / Self Care | Payer: Self-pay | Attending: Emergency Medicine | Admitting: Emergency Medicine

## 2014-09-25 ENCOUNTER — Encounter (HOSPITAL_BASED_OUTPATIENT_CLINIC_OR_DEPARTMENT_OTHER): Payer: Self-pay | Admitting: *Deleted

## 2014-09-25 ENCOUNTER — Emergency Department (HOSPITAL_BASED_OUTPATIENT_CLINIC_OR_DEPARTMENT_OTHER): Payer: Self-pay

## 2014-09-25 DIAGNOSIS — Z79899 Other long term (current) drug therapy: Secondary | ICD-10-CM | POA: Insufficient documentation

## 2014-09-25 DIAGNOSIS — Z91040 Latex allergy status: Secondary | ICD-10-CM | POA: Insufficient documentation

## 2014-09-25 DIAGNOSIS — R1032 Left lower quadrant pain: Secondary | ICD-10-CM | POA: Insufficient documentation

## 2014-09-25 DIAGNOSIS — R109 Unspecified abdominal pain: Secondary | ICD-10-CM

## 2014-09-25 DIAGNOSIS — R509 Fever, unspecified: Secondary | ICD-10-CM | POA: Insufficient documentation

## 2014-09-25 DIAGNOSIS — R112 Nausea with vomiting, unspecified: Secondary | ICD-10-CM | POA: Insufficient documentation

## 2014-09-25 DIAGNOSIS — Z880 Allergy status to penicillin: Secondary | ICD-10-CM | POA: Insufficient documentation

## 2014-09-25 DIAGNOSIS — Z9049 Acquired absence of other specified parts of digestive tract: Secondary | ICD-10-CM | POA: Insufficient documentation

## 2014-09-25 DIAGNOSIS — F329 Major depressive disorder, single episode, unspecified: Secondary | ICD-10-CM | POA: Insufficient documentation

## 2014-09-25 DIAGNOSIS — Z3202 Encounter for pregnancy test, result negative: Secondary | ICD-10-CM | POA: Insufficient documentation

## 2014-09-25 DIAGNOSIS — Z8719 Personal history of other diseases of the digestive system: Secondary | ICD-10-CM | POA: Insufficient documentation

## 2014-09-25 HISTORY — DX: Diverticulitis of intestine, part unspecified, without perforation or abscess without bleeding: K57.92

## 2014-09-25 LAB — CBC WITH DIFFERENTIAL/PLATELET
BASOS PCT: 0 % (ref 0–1)
Basophils Absolute: 0 10*3/uL (ref 0.0–0.1)
EOS ABS: 0.1 10*3/uL (ref 0.0–0.7)
Eosinophils Relative: 2 % (ref 0–5)
HCT: 42.3 % (ref 36.0–46.0)
HEMOGLOBIN: 13.8 g/dL (ref 12.0–15.0)
Lymphocytes Relative: 27 % (ref 12–46)
Lymphs Abs: 2.3 10*3/uL (ref 0.7–4.0)
MCH: 29.6 pg (ref 26.0–34.0)
MCHC: 32.6 g/dL (ref 30.0–36.0)
MCV: 90.6 fL (ref 78.0–100.0)
MONO ABS: 0.6 10*3/uL (ref 0.1–1.0)
Monocytes Relative: 7 % (ref 3–12)
Neutro Abs: 5.4 10*3/uL (ref 1.7–7.7)
Neutrophils Relative %: 64 % (ref 43–77)
Platelets: 273 10*3/uL (ref 150–400)
RBC: 4.67 MIL/uL (ref 3.87–5.11)
RDW: 13.4 % (ref 11.5–15.5)
WBC: 8.4 10*3/uL (ref 4.0–10.5)

## 2014-09-25 LAB — URINALYSIS, ROUTINE W REFLEX MICROSCOPIC
BILIRUBIN URINE: NEGATIVE
GLUCOSE, UA: NEGATIVE mg/dL
HGB URINE DIPSTICK: NEGATIVE
KETONES UR: NEGATIVE mg/dL
LEUKOCYTES UA: NEGATIVE
Nitrite: NEGATIVE
Protein, ur: NEGATIVE mg/dL
Specific Gravity, Urine: 1.015 (ref 1.005–1.030)
Urobilinogen, UA: 0.2 mg/dL (ref 0.0–1.0)
pH: 6 (ref 5.0–8.0)

## 2014-09-25 LAB — COMPREHENSIVE METABOLIC PANEL
ALT: 25 U/L (ref 14–54)
AST: 25 U/L (ref 15–41)
Albumin: 3.7 g/dL (ref 3.5–5.0)
Alkaline Phosphatase: 79 U/L (ref 38–126)
Anion gap: 8 (ref 5–15)
BILIRUBIN TOTAL: 0.3 mg/dL (ref 0.3–1.2)
BUN: 9 mg/dL (ref 6–20)
CALCIUM: 8.9 mg/dL (ref 8.9–10.3)
CO2: 23 mmol/L (ref 22–32)
Chloride: 109 mmol/L (ref 101–111)
Creatinine, Ser: 0.79 mg/dL (ref 0.44–1.00)
GFR calc Af Amer: >60 mL/min (ref >60)
GLUCOSE: 95 mg/dL (ref 65–99)
POTASSIUM: 4 mmol/L (ref 3.5–5.1)
SODIUM: 140 mmol/L (ref 135–145)
Total Protein: 7.3 g/dL (ref 6.5–8.1)

## 2014-09-25 LAB — PREGNANCY, URINE: Preg Test, Ur: NEGATIVE

## 2014-09-25 LAB — LIPASE, BLOOD: Lipase: 17 U/L — ABNORMAL LOW (ref 22–51)

## 2014-09-25 MED ORDER — IOHEXOL 300 MG/ML  SOLN
100.0000 mL | Freq: Once | INTRAMUSCULAR | Status: AC | PRN
  Administered 2014-09-25: 100 mL via INTRAVENOUS

## 2014-09-25 MED ORDER — PROMETHAZINE HCL 25 MG PO TABS: 25.0000 mg | ORAL_TABLET | Freq: Four times a day (QID) | ORAL | Status: DC | PRN

## 2014-09-25 MED ORDER — SODIUM CHLORIDE 0.9 % IV SOLN
INTRAVENOUS | Status: DC
  Administered 2014-09-25: 14:00:00 via INTRAVENOUS

## 2014-09-25 MED ORDER — FENTANYL CITRATE (PF) 100 MCG/2ML IJ SOLN
50.0000 ug | Freq: Once | INTRAMUSCULAR | Status: AC
  Administered 2014-09-25: 50 ug via INTRAVENOUS
  Filled 2014-09-25: qty 2

## 2014-09-25 MED ORDER — SODIUM CHLORIDE 0.9 % IV BOLUS (SEPSIS)
1000.0000 mL | Freq: Once | INTRAVENOUS | Status: AC
  Administered 2014-09-25: 1000 mL via INTRAVENOUS

## 2014-09-25 MED ORDER — ONDANSETRON HCL 4 MG/2ML IJ SOLN
4.0000 mg | Freq: Once | INTRAMUSCULAR | Status: AC
  Administered 2014-09-25: 4 mg via INTRAVENOUS
  Filled 2014-09-25: qty 2

## 2014-09-25 MED ORDER — HYDROCODONE-ACETAMINOPHEN 5-325 MG PO TABS: 1.0000 | ORAL_TABLET | Freq: Four times a day (QID) | ORAL | Status: DC | PRN

## 2014-09-25 NOTE — ED Notes (Addendum)
Pt reports sweating profusely last evening- unsure if she had a fever- was dx May 31st with diverticulitis and has been taking pain meds and antibiotics- no BM since 6/7 and reports LLQ tenderness

## 2014-09-25 NOTE — ED Provider Notes (Addendum)
CSN: [id number]     Arrival date & time 09/25/14  1026 History   First MD Initiated Contact with Patient 09/25/14 1121     Chief Complaint  Patient presents with  . Abdominal Pain     (Consider location/radiation/quality/duration/timing/severity/associated sxs/prior Treatment) Patient is a 34 y.o. female presenting with abdominal pain. The history is provided by the patient.  Abdominal Pain Associated symptoms: fever, nausea and vomiting   Associated symptoms: no chest pain, no dysuria and no shortness of breath    the patient diagnosed with diverticulitis May 31 based on CAT scan. Patient treated with Cipro and Flagyl. Patient was having left lower quadrant abdominal pain. Patient's symptoms have not improved significantly on the antibodies. Still has tenderness and pain in the left lower quadrant. Associated with nausea and vomiting. Patient feels like she had a fever last night. Pain is a left lower quadrant 6 out of 10 nonradiating. Pain is sharp ache in nature.  Past Medical History  Diagnosis Date  . Nerve damage   . Depression   . Diverticulitis    Past Surgical History  Procedure Laterality Date  . Cholecystectomy    . Cesarean section    . Sacral nerve stimulator placement     History reviewed. No pertinent family history. History  Substance Use Topics  . Smoking status: Never Smoker   . Smokeless tobacco: Not on file  . Alcohol Use: Yes     Comment: social   OB History    No data available     Review of Systems  Constitutional: Positive for fever.  HENT: Negative for congestion.   Eyes: Negative for visual disturbance.  Respiratory: Negative for shortness of breath.   Cardiovascular: Negative for chest pain.  Gastrointestinal: Positive for nausea, vomiting and abdominal pain.  Genitourinary: Negative for dysuria.  Musculoskeletal: Negative for back pain.  Skin: Negative for rash.  Neurological: Negative for headaches.  Hematological: Does not bruise/bleed  easily.  Psychiatric/Behavioral: Negative for confusion.      Allergies  Amoxicillin-pot clavulanate; Vigamox; Latex; and Dilaudid  Home Medications   Prior to Admission medications   Medication Sig Start Date End Date Taking? Authorizing Provider  amitriptyline (ELAVIL) 150 MG tablet Take 150 mg by mouth at bedtime.      Historical Provider, MD  FLUoxetine (PROZAC) 20 MG capsule Take 20 mg by mouth daily.    Historical Provider, MD  HYDROcodone-acetaminophen (NORCO/VICODIN) 5-325 MG per tablet Take 1 tablet by mouth every 4 (four) hours as needed for moderate pain. 09/16/14   Azalia Bilis, MD  ibuprofen (ADVIL,MOTRIN) 600 MG tablet Take 1 tablet (600 mg total) by mouth every 8 (eight) hours as needed. 09/16/14   Azalia Bilis, MD  metroNIDAZOLE (FLAGYL) 500 MG tablet Take 1 tablet (500 mg total) by mouth 2 (two) times daily. 09/16/14   Azalia Bilis, MD  pregabalin (LYRICA) 100 MG capsule Take 100 mg by mouth 3 (three) times daily.    Historical Provider, MD  promethazine (PHENERGAN) 25 MG tablet Take 1 tablet (25 mg total) by mouth every 6 (six) hours as needed for nausea or vomiting. 09/16/14   Azalia Bilis, MD  topiramate (TOPAMAX) 25 MG capsule Take 25 mg by mouth 4 (four) times daily.    Historical Provider, MD  Vitamin D, Ergocalciferol, (DRISDOL) 50000 UNITS CAPS capsule Take 50,000 Units by mouth every 7 (seven) days.    Historical Provider, MD   BP 111/70 mmHg  Pulse 83  Temp(Src) 98.2 F (36.8 C) (Oral)  Resp 18  Ht  (1.6 m)  Wt 260 lb (117.935 kg)  BMI 46.07 kg/m2  SpO2 99%  LMP 08/25/2014 Physical Exam  Constitutional: She is oriented to person, place, and time. She appears well-developed and well-nourished. No distress.  HENT:  Head: Normocephalic and atraumatic.  Mouth/Throat: Oropharynx is clear and moist.  Eyes: Conjunctivae are normal. Pupils are equal, round, and reactive to light.  Neck: Normal range of motion. Neck supple.  Cardiovascular: Normal rate,  regular rhythm and normal heart sounds.   No murmur heard. Abdominal: Soft. Bowel sounds are normal. There is tenderness.  Mild tenderness left lower quadrant.  Musculoskeletal: Normal range of motion.  Neurological: She is alert and oriented to person, place, and time. No cranial nerve deficit. She exhibits normal muscle tone. Coordination normal.  Skin: Skin is warm. No rash noted.  Nursing note and vitals reviewed.   ED Course  Procedures (including critical care time) Labs Review Labs Reviewed  LIPASE, BLOOD - Abnormal; Notable for the following:    Lipase 17 (*)    All other components within normal limits  CBC WITH DIFFERENTIAL/PLATELET  COMPREHENSIVE METABOLIC PANEL  URINALYSIS, ROUTINE W REFLEX MICROSCOPIC (NOT AT [facility])  PREGNANCY, URINE   Results for orders placed or performed during the hospital encounter of 09/25/14  CBC with Differential/Platelet  Result Value Ref Range   WBC 8.4 4.0 - 10.5 K/uL   RBC 4.67 3.87 - 5.11 MIL/uL   Hemoglobin 13.8 12.0 - 15.0 g/dL   HCT 40.9 81.1 - 91.4 %   MCV 90.6 78.0 - 100.0 fL   MCH 29.6 26.0 - 34.0 pg   MCHC 32.6 30.0 - 36.0 g/dL   RDW 78.2 95.6 - 21.3 %   Platelets 273 150 - 400 K/uL   Neutrophils Relative % 64 43 - 77 %   Neutro Abs 5.4 1.7 - 7.7 K/uL   Lymphocytes Relative 27 12 - 46 %   Lymphs Abs 2.3 0.7 - 4.0 K/uL   Monocytes Relative 7 3 - 12 %   Monocytes Absolute 0.6 0.1 - 1.0 K/uL   Eosinophils Relative 2 0 - 5 %   Eosinophils Absolute 0.1 0.0 - 0.7 K/uL   Basophils Relative 0 0 - 1 %   Basophils Absolute 0.0 0.0 - 0.1 K/uL  Comprehensive metabolic panel  Result Value Ref Range   Sodium 140 135 - 145 mmol/L   Potassium 4.0 3.5 - 5.1 mmol/L   Chloride 109 101 - 111 mmol/L   CO2 23 22 - 32 mmol/L   Glucose, Bld 95 65 - 99 mg/dL   BUN 9 6 - 20 mg/dL   Creatinine, Ser 0.86 0.44 - 1.00 mg/dL   Calcium 8.9 8.9 - 57.8 mg/dL   Total Protein 7.3 6.5 - 8.1 g/dL   Albumin 3.7 3.5 - 5.0 g/dL   AST 25 15 - 41 U/L    ALT 25 14 - 54 U/L   Alkaline Phosphatase 79 38 - 126 U/L   Total Bilirubin 0.3 0.3 - 1.2 mg/dL   GFR calc non Af Amer >60 >60 mL/min   GFR calc Af Amer >60 >60 mL/min   Anion gap 8 5 - 15  Lipase, blood  Result Value Ref Range   Lipase 17 (L) 22 - 51 U/L  Urinalysis, Routine w reflex microscopic (not at [facility])  Result Value Ref Range   Color, Urine YELLOW YELLOW   APPearance CLEAR CLEAR   Specific Gravity, Urine 1.015 1.005 -  1.030   pH 6.0 5.0 - 8.0   Glucose, UA NEGATIVE NEGATIVE mg/dL   Hgb urine dipstick NEGATIVE NEGATIVE   Bilirubin Urine NEGATIVE NEGATIVE   Ketones, ur NEGATIVE NEGATIVE mg/dL   Protein, ur NEGATIVE NEGATIVE mg/dL   Urobilinogen, UA 0.2 0.0 - 1.0 mg/dL   Nitrite NEGATIVE NEGATIVE   Leukocytes, UA NEGATIVE NEGATIVE  Pregnancy, urine  Result Value Ref Range   Preg Test, Ur NEGATIVE NEGATIVE     Imaging Review No results found.   EKG Interpretation None      MDM   Final diagnoses:  Abdominal pain    The patient diagnosed with diverticulitis most likely on May 31. Patient was treated with Cipro and Flagyl. Patient never really got better. Left lower quadrant pain is persistent. Has not gotten worse.  Labs however show improvement in the white blood cell count. Patient did have some mild tenderness left lower quadrant no guarding.  CT scan abdomen pelvis with contrast is pending to rule out persistent diverticulitis or other complicating factors.  If patient still has evidence of diverticulitis this would normally mean of admission with IV antibodies. Patient is willing to be admitted to [facility].  Patient's CT scan now without any evidence of persistent diverticulitis all the inflammation has resolved. Patient's labs are also normal. Patient will be treated symptomatically.    Vanetta Mulders, MD 09/25/14 1457  Vanetta Mulders, MD 09/25/14 1505

## 2014-09-25 NOTE — ED Notes (Signed)
Pt reports abd cramping "all over". Was seen here and dx with diverticulitis last week and states her pain continues without relief. Emesis x 1 this am, pt states she has not had bm in several days despite taking stool softeners.

## 2014-09-25 NOTE — ED Notes (Signed)
MD at bedside. 

## 2014-09-25 NOTE — Discharge Instructions (Signed)
Workup with a CT scan shows no further evidence of diverticulitis. No acute findings. Labs improved as well. Take pain medicine as directed take Phenergan as needed for the nausea and vomiting. Make an appointment to follow-up with your record Dr. Return for any new or worse symptoms.

## 2014-09-25 NOTE — ED Notes (Signed)
Patient transported to CT 

## 2015-01-10 ENCOUNTER — Emergency Department (HOSPITAL_BASED_OUTPATIENT_CLINIC_OR_DEPARTMENT_OTHER)
Admission: EM | Admit: 2015-01-10 | Discharge: 2015-01-10 | Disposition: A | Discharge status: Home / Self Care | Payer: Self-pay | Attending: Emergency Medicine | Admitting: Emergency Medicine

## 2015-01-10 ENCOUNTER — Encounter (HOSPITAL_BASED_OUTPATIENT_CLINIC_OR_DEPARTMENT_OTHER): Payer: Self-pay | Admitting: *Deleted

## 2015-01-10 ENCOUNTER — Emergency Department (HOSPITAL_BASED_OUTPATIENT_CLINIC_OR_DEPARTMENT_OTHER): Payer: Self-pay

## 2015-01-10 DIAGNOSIS — Z8719 Personal history of other diseases of the digestive system: Secondary | ICD-10-CM | POA: Insufficient documentation

## 2015-01-10 DIAGNOSIS — Y998 Other external cause status: Secondary | ICD-10-CM | POA: Insufficient documentation

## 2015-01-10 DIAGNOSIS — Y9289 Other specified places as the place of occurrence of the external cause: Secondary | ICD-10-CM | POA: Insufficient documentation

## 2015-01-10 DIAGNOSIS — Z79899 Other long term (current) drug therapy: Secondary | ICD-10-CM | POA: Insufficient documentation

## 2015-01-10 DIAGNOSIS — Z91040 Latex allergy status: Secondary | ICD-10-CM | POA: Insufficient documentation

## 2015-01-10 DIAGNOSIS — Z880 Allergy status to penicillin: Secondary | ICD-10-CM | POA: Insufficient documentation

## 2015-01-10 DIAGNOSIS — F329 Major depressive disorder, single episode, unspecified: Secondary | ICD-10-CM | POA: Insufficient documentation

## 2015-01-10 DIAGNOSIS — W228XXA Striking against or struck by other objects, initial encounter: Secondary | ICD-10-CM | POA: Insufficient documentation

## 2015-01-10 DIAGNOSIS — Y9389 Activity, other specified: Secondary | ICD-10-CM | POA: Insufficient documentation

## 2015-01-10 DIAGNOSIS — S93402A Sprain of unspecified ligament of left ankle, initial encounter: Secondary | ICD-10-CM | POA: Insufficient documentation

## 2015-01-10 MED ORDER — OXYCODONE-ACETAMINOPHEN 5-325 MG PO TABS
1.0000 | ORAL_TABLET | Freq: Once | ORAL | Status: AC
  Administered 2015-01-10: 1 via ORAL
  Filled 2015-01-10: qty 1

## 2015-01-10 NOTE — ED Notes (Signed)
Injury to left ankle from moving a large screen tv

## 2015-01-10 NOTE — ED Notes (Signed)
Ace wrap splint applied to Left ankle per EDP orders, pt instructed on ace wrap application

## 2015-01-10 NOTE — ED Provider Notes (Signed)
CSN: [id number]     Arrival date & time 01/10/15  0844 History   First MD Initiated Contact with Patient 01/10/15 [phone]     Chief Complaint  Patient presents with  . Ankle Pain     (Consider location/radiation/quality/duration/timing/severity/associated sxs/prior Treatment) Patient is a 34 y.o. female presenting with ankle pain.  Ankle Pain Location:  Ankle Time since incident:  1 day Injury: yes   Mechanism of injury comment:  Hit by wooden television part Ankle location:  L ankle Pain details:    Quality:  Aching   Radiates to:  Does not radiate   Severity:  Moderate   Onset quality:  Gradual   Duration:  1 day   Timing:  Constant   Progression:  Unchanged Chronicity:  New Relieved by:  Nothing Worsened by:  Bearing weight, flexion and extension Associated symptoms: no back pain, no muscle weakness and no neck pain     Past Medical History  Diagnosis Date  . Nerve damage   . Depression   . Diverticulitis    Past Surgical History  Procedure Laterality Date  . Cholecystectomy    . Cesarean section    . Sacral nerve stimulator placement     History reviewed. No pertinent family history. Social History  Substance Use Topics  . Smoking status: Never Smoker   . Smokeless tobacco: None  . Alcohol Use: Yes     Comment: social   OB History    No data available     Review of Systems  Musculoskeletal: Negative for back pain and neck pain.  All other systems reviewed and are negative.     Allergies  Amoxicillin-pot clavulanate; Vigamox; Latex; and Dilaudid  Home Medications   Prior to Admission medications   Medication Sig Start Date End Date Taking? Authorizing Provider  amitriptyline (ELAVIL) 150 MG tablet Take 150 mg by mouth at bedtime.      Historical Provider, MD  FLUoxetine (PROZAC) 20 MG capsule Take 20 mg by mouth daily.    Historical Provider, MD  HYDROcodone-acetaminophen (NORCO/VICODIN) 5-325 MG per tablet Take 1 tablet by mouth every 4 (four)  hours as needed for moderate pain. 09/16/14   Azalia Bilis, MD  HYDROcodone-acetaminophen (NORCO/VICODIN) 5-325 MG per tablet Take 1-2 tablets by mouth every 6 (six) hours as needed for moderate pain. 09/25/14   Vanetta Mulders, MD  ibuprofen (ADVIL,MOTRIN) 600 MG tablet Take 1 tablet (600 mg total) by mouth every 8 (eight) hours as needed. 09/16/14   Azalia Bilis, MD  metroNIDAZOLE (FLAGYL) 500 MG tablet Take 1 tablet (500 mg total) by mouth 2 (two) times daily. 09/16/14   Azalia Bilis, MD  pregabalin (LYRICA) 100 MG capsule Take 100 mg by mouth 3 (three) times daily.    Historical Provider, MD  promethazine (PHENERGAN) 25 MG tablet Take 1 tablet (25 mg total) by mouth every 6 (six) hours as needed for nausea or vomiting. 09/16/14   Azalia Bilis, MD  promethazine (PHENERGAN) 25 MG tablet Take 1 tablet (25 mg total) by mouth every 6 (six) hours as needed for nausea or vomiting. 09/25/14   Vanetta Mulders, MD  topiramate (TOPAMAX) 25 MG capsule Take 25 mg by mouth 4 (four) times daily.    Historical Provider, MD  Vitamin D, Ergocalciferol, (DRISDOL) 50000 UNITS CAPS capsule Take 50,000 Units by mouth every 7 (seven) days.    Historical Provider, MD   BP 141/97 mmHg  Pulse 99  Temp(Src) 97.9 F (36.6 C) (Oral)  Resp 18  Ht  (1.676 m)  Wt 260 lb (117.935 kg)  BMI 41.99 kg/m2  SpO2 99%  LMP 12/30/2014 Physical Exam  Constitutional: She is oriented to person, place, and time. She appears well-developed and well-nourished.  HENT:  Head: Normocephalic and atraumatic.  Right Ear: External ear normal.  Left Ear: External ear normal.  Eyes: Conjunctivae and EOM are normal. Pupils are equal, round, and reactive to light.  Neck: Normal range of motion. Neck supple.  Cardiovascular: Normal rate, regular rhythm, normal heart sounds and intact distal pulses.   Pulmonary/Chest: Effort normal and breath sounds normal.  Abdominal: Soft. Bowel sounds are normal. There is no tenderness.  Musculoskeletal:        Left ankle: She exhibits decreased range of motion (2/2 pain) and swelling. Tenderness. Lateral malleolus tenderness found.  Neurological: She is alert and oriented to person, place, and time.  Skin: Skin is warm and dry.  Vitals reviewed.   ED Course  Procedures (including critical care time) Labs Review Labs Reviewed - No data to display  Imaging Review Dg Ankle Complete Left  01/10/2015   CLINICAL DATA:  Television landed on ankle/ foot yesterday at home. Lateral malleolus pain. Initial encounter.  EXAM: LEFT ANKLE COMPLETE - 3+ VIEW  COMPARISON:  None.  FINDINGS: There is soft tissue swelling involving the lateral aspect of the ankle. No acute fracture or dislocation is identified. A small plantar calcaneal enthesophyte is noted. No lytic or blastic osseous lesion or radiopaque foreign body.  IMPRESSION: Soft tissue swelling without acute osseous abnormality identified.   Electronically Signed   By: Sebastian Ache M.D.   On: 01/10/2015 09:28   Dg Foot Complete Left  01/10/2015   CLINICAL DATA:  Television landed on left ankle/ foot yesterday at home. Swelling and pain inferior to the lateral malleolus. Initial encounter.  EXAM: LEFT FOOT - COMPLETE 3+ VIEW  COMPARISON:  None.  FINDINGS: Soft tissue swelling is noted at the lateral aspect of the ankle and foot. No acute fracture or dislocation is identified. A small plantar calcaneal enthesophyte is noted. No lytic or blastic osseous lesion or radiopaque foreign body is seen.  IMPRESSION: Lateral ankle/ foot soft tissue swelling without acute osseous abnormality identified.   Electronically Signed   By: Sebastian Ache M.D.   On: 01/10/2015 09:30   I have personally reviewed and evaluated these images and lab results as part of my medical decision-making.   EKG Interpretation None      MDM   Final diagnoses:  Left ankle sprain, initial encounter    34 y.o. female with pertinent PMH of neuropathy presents with L ankle pain after direct  blow.  Exam as above.  Dorna Bloom revealed no acute fracture.  Placed in splint and to fu with PCP.    I have reviewed all laboratory and imaging studies if ordered as above  1. Left ankle sprain, initial encounter         Mirian Mo, MD 01/10/15 [phone]

## 2015-01-10 NOTE — ED Notes (Signed)
Ice pack provided to left ankle, per pt comfort

## 2015-01-10 NOTE — ED Notes (Signed)
Xrays complete

## 2015-01-10 NOTE — Discharge Instructions (Signed)

## 2015-05-18 ENCOUNTER — Emergency Department (HOSPITAL_BASED_OUTPATIENT_CLINIC_OR_DEPARTMENT_OTHER): Payer: Self-pay

## 2015-05-18 ENCOUNTER — Encounter (HOSPITAL_BASED_OUTPATIENT_CLINIC_OR_DEPARTMENT_OTHER): Payer: Self-pay | Admitting: *Deleted

## 2015-05-18 ENCOUNTER — Emergency Department (HOSPITAL_BASED_OUTPATIENT_CLINIC_OR_DEPARTMENT_OTHER)
Admission: EM | Admit: 2015-05-18 | Discharge: 2015-05-18 | Disposition: A | Discharge status: Home / Self Care | Payer: Self-pay | Attending: Emergency Medicine | Admitting: Emergency Medicine

## 2015-05-18 DIAGNOSIS — Z91040 Latex allergy status: Secondary | ICD-10-CM | POA: Insufficient documentation

## 2015-05-18 DIAGNOSIS — Z8669 Personal history of other diseases of the nervous system and sense organs: Secondary | ICD-10-CM | POA: Insufficient documentation

## 2015-05-18 DIAGNOSIS — Y9389 Activity, other specified: Secondary | ICD-10-CM | POA: Insufficient documentation

## 2015-05-18 DIAGNOSIS — Y9289 Other specified places as the place of occurrence of the external cause: Secondary | ICD-10-CM | POA: Insufficient documentation

## 2015-05-18 DIAGNOSIS — S8001XA Contusion of right knee, initial encounter: Secondary | ICD-10-CM | POA: Insufficient documentation

## 2015-05-18 DIAGNOSIS — Y998 Other external cause status: Secondary | ICD-10-CM | POA: Insufficient documentation

## 2015-05-18 DIAGNOSIS — R519 Headache, unspecified: Secondary | ICD-10-CM

## 2015-05-18 DIAGNOSIS — R51 Headache: Secondary | ICD-10-CM

## 2015-05-18 DIAGNOSIS — W01198A Fall on same level from slipping, tripping and stumbling with subsequent striking against other object, initial encounter: Secondary | ICD-10-CM | POA: Insufficient documentation

## 2015-05-18 DIAGNOSIS — S0093XA Contusion of unspecified part of head, initial encounter: Secondary | ICD-10-CM

## 2015-05-18 DIAGNOSIS — R55 Syncope and collapse: Secondary | ICD-10-CM | POA: Insufficient documentation

## 2015-05-18 DIAGNOSIS — Z792 Long term (current) use of antibiotics: Secondary | ICD-10-CM | POA: Insufficient documentation

## 2015-05-18 DIAGNOSIS — F329 Major depressive disorder, single episode, unspecified: Secondary | ICD-10-CM | POA: Insufficient documentation

## 2015-05-18 DIAGNOSIS — S0083XA Contusion of other part of head, initial encounter: Secondary | ICD-10-CM | POA: Insufficient documentation

## 2015-05-18 DIAGNOSIS — Z8719 Personal history of other diseases of the digestive system: Secondary | ICD-10-CM | POA: Insufficient documentation

## 2015-05-18 DIAGNOSIS — W19XXXA Unspecified fall, initial encounter: Secondary | ICD-10-CM

## 2015-05-18 DIAGNOSIS — Z79899 Other long term (current) drug therapy: Secondary | ICD-10-CM | POA: Insufficient documentation

## 2015-05-18 DIAGNOSIS — Z880 Allergy status to penicillin: Secondary | ICD-10-CM | POA: Insufficient documentation

## 2015-05-18 LAB — BASIC METABOLIC PANEL
ANION GAP: 8 (ref 5–15)
BUN: 7 mg/dL (ref 6–20)
CHLORIDE: 108 mmol/L (ref 101–111)
CO2: 26 mmol/L (ref 22–32)
Calcium: 8.6 mg/dL — ABNORMAL LOW (ref 8.9–10.3)
Creatinine, Ser: 0.86 mg/dL (ref 0.44–1.00)
GFR calc non Af Amer: >60 mL/min (ref >60)
Glucose, Bld: 100 mg/dL — ABNORMAL HIGH (ref 65–99)
POTASSIUM: 3.9 mmol/L (ref 3.5–5.1)
SODIUM: 142 mmol/L (ref 135–145)

## 2015-05-18 LAB — CBC
HCT: 39.7 % (ref 36.0–46.0)
HEMOGLOBIN: 12.7 g/dL (ref 12.0–15.0)
MCH: 27.5 pg (ref 26.0–34.0)
MCHC: 32 g/dL (ref 30.0–36.0)
MCV: 86.1 fL (ref 78.0–100.0)
Platelets: 279 10*3/uL (ref 150–400)
RBC: 4.61 MIL/uL (ref 3.87–5.11)
RDW: 13.6 % (ref 11.5–15.5)
WBC: 6.8 10*3/uL (ref 4.0–10.5)

## 2015-05-18 MED ORDER — ONDANSETRON 8 MG PO TBDP
8.0000 mg | ORAL_TABLET | Freq: Once | ORAL | Status: AC
  Administered 2015-05-18: 8 mg via ORAL
  Filled 2015-05-18: qty 1

## 2015-05-18 MED ORDER — HYDROCODONE-ACETAMINOPHEN 5-325 MG PO TABS
2.0000 | ORAL_TABLET | Freq: Once | ORAL | Status: AC
  Administered 2015-05-18: 2 via ORAL
  Filled 2015-05-18: qty 2

## 2015-05-18 MED ORDER — DIPHENHYDRAMINE HCL 50 MG/ML IJ SOLN
25.0000 mg | Freq: Once | INTRAMUSCULAR | Status: AC
  Administered 2015-05-18: 25 mg via INTRAVENOUS
  Filled 2015-05-18: qty 1

## 2015-05-18 MED ORDER — TRAMADOL HCL 50 MG PO TABS
50.0000 mg | ORAL_TABLET | Freq: Once | ORAL | Status: AC
  Administered 2015-05-18: 50 mg via ORAL
  Filled 2015-05-18: qty 1

## 2015-05-18 NOTE — ED Notes (Signed)
MD at bedside. 

## 2015-05-18 NOTE — ED Notes (Signed)
Assisted with [facility]. Pt voided approx 200 ml grossly bloody urine. States she is on her period. Dr. advised and no further orders given. He will go in and speak with pt.

## 2015-05-18 NOTE — Discharge Instructions (Signed)
It was our pleasure to provide your ER care today - we hope that you feel better.  Rest. Drink plenty of fluids.  Follow up with your primary care doctor for recheck in the next few days.  Return to ER if worse, new symptoms, fevers, trouble breathing, persistent vomiting, medical emergency, other concern.  You were given pain medication in the ER - no driving for the next 6 hours.    Near-Syncope Near-syncope (commonly known as near fainting) is sudden weakness, dizziness, or feeling like you might pass out. During an episode of near-syncope, you may also develop pale skin, have tunnel vision, or feel sick to your stomach (nauseous). Near-syncope may occur when getting up after sitting or while standing for a long time. It is caused by a sudden decrease in blood flow to the brain. This decrease can result from various causes or triggers, most of which are not serious. However, because near-syncope can sometimes be a sign of something serious, a medical evaluation is required. The specific cause is often not determined. HOME CARE INSTRUCTIONS  Monitor your condition for any changes. The following actions may help to alleviate any discomfort you are experiencing:  Have someone stay with you until you feel stable.  Lie down right away and prop your feet up if you start feeling like you might faint. Breathe deeply and steadily. Wait until all the symptoms have passed. Most of these episodes last only a few minutes. You may feel tired for several hours.   Drink enough fluids to keep your urine clear or pale yellow.   If you are taking blood pressure or heart medicine, get up slowly when seated or lying down. Take several minutes to sit and then stand. This can reduce dizziness.  Follow up with your health care provider as directed. SEEK IMMEDIATE MEDICAL CARE IF:   You have a severe headache.   You have unusual pain in the chest, abdomen, or back.   You are bleeding from the mouth or  rectum, or you have black or tarry stool.   You have an irregular or very fast heartbeat.   You have repeated fainting or have seizure-like jerking during an episode.   You faint when sitting or lying down.   You have confusion.   You have difficulty walking.   You have severe weakness.   You have vision problems.  MAKE SURE YOU:   Understand these instructions.  Will watch your condition.  Will get help right away if you are not doing well or get worse.   This information is not intended to replace advice given to you by your health care provider. Make sure you discuss any questions you have with your health care provider.   Document Released: 04/04/2005 Document Revised: 04/09/2013 Document Reviewed: 09/07/2012 Elsevier Interactive Patient Education 2016 [employer]    Contusion A contusion is a deep bruise. Contusions are the result of a blunt injury to tissues and muscle fibers under the skin. The injury causes bleeding under the skin. The skin overlying the contusion may turn blue, purple, or yellow. Minor injuries will give you a painless contusion, but more severe contusions may stay painful and swollen for a few weeks.  CAUSES  This condition is usually caused by a blow, trauma, or direct force to an area of the body. SYMPTOMS  Symptoms of this condition include:  Swelling of the injured area.  Pain and tenderness in the injured area.  Discoloration. The area may have redness  and then turn blue, purple, or yellow. DIAGNOSIS  This condition is diagnosed based on a physical exam and medical history. An X-ray, CT scan, or MRI may be needed to determine if there are any associated injuries, such as broken bones (fractures). TREATMENT  Specific treatment for this condition depends on what area of the body was injured. In general, the best treatment for a contusion is resting, icing, applying pressure to (compression), and elevating the injured area. This is  often called the RICE strategy. Over-the-counter anti-inflammatory medicines may also be recommended for pain control.  HOME CARE INSTRUCTIONS   Rest the injured area.  If directed, apply ice to the injured area:  Put ice in a plastic bag.  Place a towel between your skin and the bag.  Leave the ice on for 20 minutes, 2-3 times per day.  If directed, apply light compression to the injured area using an elastic bandage. Make sure the bandage is not wrapped too tightly. Remove and reapply the bandage as directed by your health care provider.  If possible, raise (elevate) the injured area above the level of your heart while you are sitting or lying down.  Take over-the-counter and prescription medicines only as told by your health care provider. SEEK MEDICAL CARE IF:  Your symptoms do not improve after several days of treatment.  Your symptoms get worse.  You have difficulty moving the injured area. SEEK IMMEDIATE MEDICAL CARE IF:   You have severe pain.  You have numbness in a hand or foot.  Your hand or foot turns pale or cold.   This information is not intended to replace advice given to you by your health care provider. Make sure you discuss any questions you have with your health care provider.   Document Released: 01/12/2005 Document Revised: 12/24/2014 Document Reviewed: 08/20/2014 Elsevier Interactive Patient Education 2016 [employer]   Head Injury, Adult You have received a head injury. It does not appear serious at this time. Headaches and vomiting are common following head injury. It should be easy to awaken from sleeping. Sometimes it is necessary for you to stay in the emergency department for a while for observation. Sometimes admission to the hospital may be needed. After injuries such as yours, most problems occur within the first 24 hours, but side effects may occur up to 7-10 days after the injury. It is important for you to carefully monitor your condition  and contact your health care provider or seek immediate medical care if there is a change in your condition. WHAT ARE THE TYPES OF HEAD INJURIES? Head injuries can be as minor as a bump. Some head injuries can be more severe. More severe head injuries include:  A jarring injury to the brain (concussion).  A bruise of the brain (contusion). This mean there is bleeding in the brain that can cause swelling.  A cracked skull (skull fracture).  Bleeding in the brain that collects, clots, and forms a bump (hematoma). WHAT CAUSES A HEAD INJURY? A serious head injury is most likely to happen to someone who is in a car wreck and is not wearing a seat belt. Other causes of major head injuries include bicycle or motorcycle accidents, sports injuries, and falls. HOW ARE HEAD INJURIES DIAGNOSED? A complete history of the event leading to the injury and your current symptoms will be helpful in diagnosing head injuries. Many times, pictures of the brain, such as CT or MRI are needed to see the extent of the injury.  Often, an overnight hospital stay is necessary for observation.  WHEN SHOULD I SEEK IMMEDIATE MEDICAL CARE?  You should get help right away if:  You have confusion or drowsiness.  You feel sick to your stomach (nauseous) or have continued, forceful vomiting.  You have dizziness or unsteadiness that is getting worse.  You have severe, continued headaches not relieved by medicine. Only take over-the-counter or prescription medicines for pain, fever, or discomfort as directed by your health care provider.  You do not have normal function of the arms or legs or are unable to walk.  You notice changes in the black spots in the center of the colored part of your eye (pupil).  You have a clear or bloody fluid coming from your nose or ears.  You have a loss of vision. During the next 24 hours after the injury, you must stay with someone who can watch you for the warning signs. This person should  contact local emergency services (911 in the [place] if you have seizures, you become unconscious, or you are unable to wake up. HOW CAN I PREVENT A HEAD INJURY IN THE FUTURE? The most important factor for preventing major head injuries is avoiding motor vehicle accidents. To minimize the potential for damage to your head, it is crucial to wear seat belts while riding in motor vehicles. Wearing helmets while bike riding and playing collision sports (like football) is also helpful. Also, avoiding dangerous activities around the house will further help reduce your risk of head injury.  WHEN CAN I RETURN TO NORMAL ACTIVITIES AND ATHLETICS? You should be reevaluated by your health care provider before returning to these activities. If you have any of the following symptoms, you should not return to activities or contact sports until 1 week after the symptoms have stopped:  Persistent headache.  Dizziness or vertigo.  Poor attention and concentration.  Confusion.  Memory problems.  Nausea or vomiting.  Fatigue or tire easily.  Irritability.  Intolerant of bright lights or loud noises.  Anxiety or depression.  Disturbed sleep. MAKE SURE YOU:   Understand these instructions.  Will watch your condition.  Will get help right away if you are not doing well or get worse.   This information is not intended to replace advice given to you by your health care provider. Make sure you discuss any questions you have with your health care provider.   Document Released: 04/04/2005 Document Revised: 04/25/2014 Document Reviewed: 12/10/2012 Elsevier Interactive Patient Education [employer]

## 2015-05-18 NOTE — ED Notes (Signed)
She stood this am, passed out and hit her head. Headache and nausea.

## 2015-05-18 NOTE — ED Notes (Signed)
C/o itching all over. No rash noted. Dr. Denton Lank aware.

## 2015-05-18 NOTE — ED Notes (Signed)
Attempted to ambulate Pt.   Pt. Will not ambulate due to she crys and states " I can't .. It hurts my legs..  I have neuropathy and I don't walk much unless I have to"  "I hurt".  Pt. Unsteady and placed back in the stretcher with family at bedside.

## 2015-05-18 NOTE — ED Notes (Signed)
Pt and family given d/c instructions. VerbALIZES UNDERSTANDING. nO QUESTIONS.

## 2015-05-18 NOTE — ED Provider Notes (Addendum)
CSN: [id number]     Arrival date & time 05/18/15  1053 History   First MD Initiated Contact with Patient 05/18/15 1114     Chief Complaint  Patient presents with  . Loss of Consciousness     (Consider location/radiation/quality/duration/timing/severity/associated sxs/prior Treatment) Patient is a 35 y.o. female presenting with syncope. The history is provided by the patient and a relative.  Loss of Consciousness Associated symptoms: headaches   Associated symptoms: no chest pain, no confusion, no fever, no palpitations, no shortness of breath, no vomiting and no weakness   Patient c/o fall this AM.  States had gotten up from bed, stood up, felt faint, and then fell to floor, hitting head, w brief loc.  Was able to call family for help.  Post hitting head, c/o headache, diffuse. ?hx migraines in past. Denies neck or back pain. No new numbness or weakness. States last night had felt fine, at baseline.  No recent blood loss. No palpitations or chest pain. No sob or unusual doe. Denies change in meds or new meds. No abd pain. No vomiting or diarrhea. No dysuria, or gu c/o. Pt c/o head pain and right knee pain post fall. Pain mod-severe. Constant. Worse w palpation.   No recent fever or chills.      Past Medical History  Diagnosis Date  . Nerve damage   . Depression   . Diverticulitis    Past Surgical History  Procedure Laterality Date  . Cholecystectomy    . Cesarean section    . Sacral nerve stimulator placement     No family history on file. Social History  Substance Use Topics  . Smoking status: Never Smoker   . Smokeless tobacco: None  . Alcohol Use: Yes     Comment: social   OB History    No data available     Review of Systems  Constitutional: Negative for fever and chills.  HENT: Negative for sore throat.   Eyes: Negative for redness, itching and visual disturbance.  Respiratory: Negative for cough and shortness of breath.   Cardiovascular: Positive for syncope.  Negative for chest pain, palpitations and leg swelling.  Gastrointestinal: Negative for vomiting, abdominal pain, diarrhea and blood in stool.  Genitourinary: Negative for dysuria, flank pain and vaginal bleeding.  Musculoskeletal: Negative for back pain and neck pain.  Skin: Negative for rash.  Neurological: Positive for headaches. Negative for weakness and numbness.  Hematological: Does not bruise/bleed easily.  Psychiatric/Behavioral: Negative for confusion.      Allergies  Amoxicillin-pot clavulanate; Vigamox; Latex; and Dilaudid  Home Medications   Prior to Admission medications   Medication Sig Start Date End Date Taking? Authorizing Provider  baclofen (LIORESAL) 10 MG tablet Take 10 mg by mouth 3 (three) times daily.   Yes Historical Provider, MD  amitriptyline (ELAVIL) 150 MG tablet Take 150 mg by mouth at bedtime.      Historical Provider, MD  FLUoxetine (PROZAC) 20 MG capsule Take 20 mg by mouth daily.    Historical Provider, MD  HYDROcodone-acetaminophen (NORCO/VICODIN) 5-325 MG per tablet Take 1 tablet by mouth every 4 (four) hours as needed for moderate pain. 09/16/14   Azalia Bilis, MD  HYDROcodone-acetaminophen (NORCO/VICODIN) 5-325 MG per tablet Take 1-2 tablets by mouth every 6 (six) hours as needed for moderate pain. 09/25/14   Vanetta Mulders, MD  ibuprofen (ADVIL,MOTRIN) 600 MG tablet Take 1 tablet (600 mg total) by mouth every 8 (eight) hours as needed. 09/16/14   Azalia Bilis, MD  metroNIDAZOLE (FLAGYL) 500 MG tablet Take 1 tablet (500 mg total) by mouth 2 (two) times daily. 09/16/14   Azalia Bilis, MD  pregabalin (LYRICA) 100 MG capsule Take 100 mg by mouth 3 (three) times daily.    Historical Provider, MD  promethazine (PHENERGAN) 25 MG tablet Take 1 tablet (25 mg total) by mouth every 6 (six) hours as needed for nausea or vomiting. 09/16/14   Azalia Bilis, MD  promethazine (PHENERGAN) 25 MG tablet Take 1 tablet (25 mg total) by mouth every 6 (six) hours as needed for  nausea or vomiting. 09/25/14   Vanetta Mulders, MD  topiramate (TOPAMAX) 25 MG capsule Take 25 mg by mouth 4 (four) times daily.    Historical Provider, MD  Vitamin D, Ergocalciferol, (DRISDOL) 50000 UNITS CAPS capsule Take 50,000 Units by mouth every 7 (seven) days.    Historical Provider, MD   BP 126/95 mmHg  Pulse 92  Temp(Src) 98.2 F (36.8 C) (Oral)  Resp 18  Ht  (1.626 m)  Wt 109.317 kg  BMI 41.35 kg/m2  SpO2 97%  LMP 05/16/2015 Physical Exam  Constitutional: She is oriented to person, place, and time. She appears well-developed and well-nourished. No distress.  HENT:  Nose: Nose normal.  Mouth/Throat: Oropharynx is clear and moist.  No sinus or temporal tenderness. No signif head or scalp sts noted.   Eyes: Conjunctivae and EOM are normal. Pupils are equal, round, and reactive to light. No scleral icterus.  Neck: Normal range of motion. Neck supple. No tracheal deviation present. No thyromegaly present.  No stiffness or rigidity.   Cardiovascular: Normal rate, regular rhythm, normal heart sounds and intact distal pulses.  Exam reveals no gallop and no friction rub.   No murmur heard. Pulmonary/Chest: Effort normal and breath sounds normal. No respiratory distress. She exhibits no tenderness.  Abdominal: Soft. Normal appearance and bowel sounds are normal. She exhibits no distension. There is no tenderness.  obese  Genitourinary:  No cva tenderness.  Musculoskeletal: Normal range of motion. She exhibits no edema or tenderness.  CTLS spine, non tender, aligned, no step off. Tenderness right knee, no effusion, grossly intact. Distal pulses palp bil.  Otherwise no focal bony tenderness on ext exam.   Neurological: She is alert and oriented to person, place, and time. No cranial nerve deficit.  Motor intact bilaterally. stre 5/5. sens grossly intact. Steady gait.   Skin: Skin is warm and dry. No rash noted. She is not diaphoretic.  Psychiatric: She has a normal mood and  affect.  Nursing note and vitals reviewed.   ED Course  Procedures (including critical care time) Labs Review  Results for orders placed or performed during the hospital encounter of 05/18/15  CBC  Result Value Ref Range   WBC 6.8 4.0 - 10.5 K/uL   RBC 4.61 3.87 - 5.11 MIL/uL   Hemoglobin 12.7 12.0 - 15.0 g/dL   HCT 09.8 11.9 - 14.7 %   MCV 86.1 78.0 - 100.0 fL   MCH 27.5 26.0 - 34.0 pg   MCHC 32.0 30.0 - 36.0 g/dL   RDW 82.9 56.2 - 13.0 %   Platelets 279 150 - 400 K/uL  Basic metabolic panel  Result Value Ref Range   Sodium 142 135 - 145 mmol/L   Potassium 3.9 3.5 - 5.1 mmol/L   Chloride 108 101 - 111 mmol/L   CO2 26 22 - 32 mmol/L   Glucose, Bld 100 (H) 65 - 99 mg/dL   BUN 7 6 -  20 mg/dL   Creatinine, Ser 1.61 0.44 - 1.00 mg/dL   Calcium 8.6 (L) 8.9 - 10.3 mg/dL   GFR calc non Af Amer >60 >60 mL/min   GFR calc Af Amer >60 >60 mL/min   Anion gap 8 5 - 15   Ct Head Wo Contrast  05/18/2015  CLINICAL DATA:  Status post fall, hit head.  Loss of consciousness. EXAM: CT HEAD WITHOUT CONTRAST TECHNIQUE: Contiguous axial images were obtained from the base of the skull through the vertex without intravenous contrast. COMPARISON:  MR brain 10/03/2011 FINDINGS: There is no evidence of mass effect, midline shift or extra-axial fluid collections. There is no evidence of a space-occupying lesion or intracranial hemorrhage. There is no evidence of a cortical-based area of acute infarction. The ventricles and sulci are appropriate for the patient's age. The basal cisterns are patent. Visualized portions of the orbits are unremarkable. The visualized portions of the paranasal sinuses and mastoid air cells are unremarkable. The osseous structures are unremarkable. IMPRESSION: Normal CT of the brain without intravenous contrast. Electronically Signed   By: Elige Ko   On: 05/18/2015 12:12   Dg Knee Complete 4 Views Right  05/18/2015  CLINICAL DATA:  Right knee pain and abrasion after fall this  morning EXAM: RIGHT KNEE - COMPLETE 4+ VIEW COMPARISON:  03/13/2010 FINDINGS: Minimal calcification of the lateral meniscus. No fracture or dislocation. No joint effusion. IMPRESSION: No acute findings. Electronically Signed   By: Esperanza Heir M.D.   On: 05/18/2015 12:18      I have personally reviewed and evaluated these images and lab results as part of my medical decision-making.   EKG Interpretation   Date/Time:  Monday May 18 2015 11:55:32 EST Ventricular Rate:  87 PR Interval:  145 QRS Duration: 105 QT Interval:  376 QTC Calculation: 452 R Axis:   124 Text Interpretation:  Sinus rhythm No significant change since last  tracing Confirmed by Denton Lank  MD, Caryn Bee ([id number]) on 05/18/2015 1:08:53 PM      MDM   Ct. Jay Schlichter.  Labs.  Hydrocodone po. zofran po.  Reviewed nursing notes and prior charts for additional history.   Pt notes itching post meds.  States has had same meds in past with no allergic rxn.  No hives/rash noted. No throat swelling. No wheezing.  Will give benadryl for symptom relief.  Spine nt.  xrays neg for acute process.  Pt requests additional med for pain. Ultram po.   Ambulate in hall.  Po fluids.  Pt currently appears stable for d/c.       Cathren Laine, MD 05/18/15 1410

## 2015-06-25 ENCOUNTER — Emergency Department (HOSPITAL_BASED_OUTPATIENT_CLINIC_OR_DEPARTMENT_OTHER)
Admission: EM | Admit: 2015-06-25 | Discharge: 2015-06-25 | Disposition: A | Discharge status: Home / Self Care | Payer: Self-pay | Attending: Emergency Medicine | Admitting: Emergency Medicine

## 2015-06-25 ENCOUNTER — Encounter (HOSPITAL_BASED_OUTPATIENT_CLINIC_OR_DEPARTMENT_OTHER): Payer: Self-pay | Admitting: *Deleted

## 2015-06-25 DIAGNOSIS — Z8719 Personal history of other diseases of the digestive system: Secondary | ICD-10-CM | POA: Insufficient documentation

## 2015-06-25 DIAGNOSIS — G629 Polyneuropathy, unspecified: Secondary | ICD-10-CM | POA: Insufficient documentation

## 2015-06-25 DIAGNOSIS — Z79899 Other long term (current) drug therapy: Secondary | ICD-10-CM | POA: Insufficient documentation

## 2015-06-25 DIAGNOSIS — Z91040 Latex allergy status: Secondary | ICD-10-CM | POA: Insufficient documentation

## 2015-06-25 DIAGNOSIS — R200 Anesthesia of skin: Secondary | ICD-10-CM | POA: Insufficient documentation

## 2015-06-25 DIAGNOSIS — Z792 Long term (current) use of antibiotics: Secondary | ICD-10-CM | POA: Insufficient documentation

## 2015-06-25 DIAGNOSIS — Z880 Allergy status to penicillin: Secondary | ICD-10-CM | POA: Insufficient documentation

## 2015-06-25 DIAGNOSIS — L089 Local infection of the skin and subcutaneous tissue, unspecified: Secondary | ICD-10-CM | POA: Insufficient documentation

## 2015-06-25 DIAGNOSIS — F329 Major depressive disorder, single episode, unspecified: Secondary | ICD-10-CM | POA: Insufficient documentation

## 2015-06-25 MED ORDER — DOXYCYCLINE HYCLATE 100 MG PO CAPS
100.0000 mg | ORAL_CAPSULE | Freq: Two times a day (BID) | ORAL | Status: DC
Start: 2015-06-25 — End: 2015-10-25

## 2015-06-25 MED FILL — DOXYCYCLINE HYC 100 MG CAP: 100 | 7 days supply | Qty: 14 | Fill #0

## 2015-06-25 NOTE — ED Notes (Signed)
Both rt and lt great toes noted to be red in color and slightly swollen

## 2015-06-25 NOTE — ED Notes (Signed)
Pt presents with pain at nail beds of both rt and lt great toes, states onset of pain and drainage from nails presented approx 3 days ago.

## 2015-06-25 NOTE — Discharge Instructions (Signed)
Thick antibiotic as directed. Soak both feet in warm water for 20 minutes twice a day can add Epson salt. Make an appointment for your primary care doctor or the foot doctor. Return for any new or worse symptoms.

## 2015-06-25 NOTE — ED Provider Notes (Signed)
CSN: [id number]     Arrival date & time 06/25/15  1324 History   First MD Initiated Contact with Patient 06/25/15 1427     Chief Complaint  Patient presents with  . Nail Problem     (Consider location/radiation/quality/duration/timing/severity/associated sxs/prior Treatment) The history is provided by the patient.  Patient with complaint of pain to both great toenails. The purulent discharge. Patient wore a pair of boots recently without socks. Following that both of her nails on her great toes were black or purplish in color. Now patient states right toenail is loose. And that has been purulent discharge from under both of them. Suspect that the boots pulled that lifted both toenails up. And that the black card darkness was a subungual hematoma. Patient has a history of neuropathy but not diabetes. Patient does have a podiatrist at [facility]. Also has a local primary care doctor. Patient is allergic to penicillin.  Past Medical History  Diagnosis Date  . Nerve damage   . Depression   . Diverticulitis    Past Surgical History  Procedure Laterality Date  . Cholecystectomy    . Cesarean section    . Sacral nerve stimulator placement     No family history on file. Social History  Substance Use Topics  . Smoking status: Never Smoker   . Smokeless tobacco: None  . Alcohol Use: Yes     Comment: social   OB History    No data available     Review of Systems  Constitutional: Negative for fever.  HENT: Negative for congestion.   Eyes: Negative for redness.  Respiratory: Negative for shortness of breath.   Cardiovascular: Negative for chest pain.  Gastrointestinal: Negative for abdominal pain.  Musculoskeletal: Negative for back pain.  Skin: Positive for wound.  Neurological: Positive for numbness. Negative for headaches.  Hematological: Does not bruise/bleed easily.  Psychiatric/Behavioral: Negative for confusion.      Allergies  Amoxicillin-pot clavulanate; Vigamox;  Latex; and Dilaudid  Home Medications   Prior to Admission medications   Medication Sig Start Date End Date Taking? Authorizing Provider  amitriptyline (ELAVIL) 150 MG tablet Take 150 mg by mouth at bedtime.      Historical Provider, MD  baclofen (LIORESAL) 10 MG tablet Take 10 mg by mouth 3 (three) times daily.    Historical Provider, MD  doxycycline (VIBRAMYCIN) 100 MG capsule Take 1 capsule (100 mg total) by mouth 2 (two) times daily. 06/25/15   Vanetta Mulders, MD  FLUoxetine (PROZAC) 20 MG capsule Take 20 mg by mouth daily.    Historical Provider, MD  HYDROcodone-acetaminophen (NORCO/VICODIN) 5-325 MG per tablet Take 1 tablet by mouth every 4 (four) hours as needed for moderate pain. 09/16/14   Azalia Bilis, MD  HYDROcodone-acetaminophen (NORCO/VICODIN) 5-325 MG per tablet Take 1-2 tablets by mouth every 6 (six) hours as needed for moderate pain. 09/25/14   Vanetta Mulders, MD  ibuprofen (ADVIL,MOTRIN) 600 MG tablet Take 1 tablet (600 mg total) by mouth every 8 (eight) hours as needed. 09/16/14   Azalia Bilis, MD  metroNIDAZOLE (FLAGYL) 500 MG tablet Take 1 tablet (500 mg total) by mouth 2 (two) times daily. 09/16/14   Azalia Bilis, MD  pregabalin (LYRICA) 100 MG capsule Take 100 mg by mouth 3 (three) times daily.    Historical Provider, MD  promethazine (PHENERGAN) 25 MG tablet Take 1 tablet (25 mg total) by mouth every 6 (six) hours as needed for nausea or vomiting. 09/16/14   Azalia Bilis, MD  promethazine (  PHENERGAN) 25 MG tablet Take 1 tablet (25 mg total) by mouth every 6 (six) hours as needed for nausea or vomiting. 09/25/14   Vanetta MuldersScott Sorah Falkenstein, MD  topiramate (TOPAMAX) 25 MG capsule Take 25 mg by mouth 4 (four) times daily.    Historical Provider, MD  Vitamin D, Ergocalciferol, (DRISDOL) 50000 UNITS CAPS capsule Take 50,000 Units by mouth every 7 (seven) days.    Historical Provider, MD   BP 133/86 mmHg  Pulse 94  Temp(Src) 98.5 F (36.9 C) (Oral)  Resp 20  Ht 5\' 3"  (1.6 m)  Wt 109.317 kg   BMI 42.70 kg/m2  SpO2 98%  LMP 06/17/2015 Physical Exam  Constitutional: She is oriented to person, place, and time. She appears well-developed and well-nourished. No distress.  HENT:  Head: Normocephalic and atraumatic.  Eyes: Conjunctivae and EOM are normal. Pupils are equal, round, and reactive to light.  Neck: Normal range of motion.  Cardiovascular: Normal rate, regular rhythm and normal heart sounds.   No murmur heard. Pulmonary/Chest: Effort normal and breath sounds normal. No respiratory distress.  Musculoskeletal: Normal range of motion. She exhibits tenderness.  Normal except for both the great toes. Evidence of cellulitis probably due to the toenail being pulled off partially by a peer boots. No active bleeding. The purulent discharge from underneath the nail." Right great toenail is loose. Refill one second dorsalis pedis pulse 2+. Patient has decreased sensation due to her neuropathy.  Neurological: She is alert and oriented to person, place, and time. No cranial nerve deficit. She exhibits normal muscle tone. Coordination normal.  Nursing note and vitals reviewed.   ED Course  Procedures (including critical care time) Labs Review Labs Reviewed - No data to display  Imaging Review No results found. I have personally reviewed and evaluated these images and lab results as part of my medical decision-making.   EKG Interpretation None      MDM   Final diagnoses:  Toe infection    Patient most likely developed bilateral subungual hematomas to both great toes. Due to shoes pulling on the nail. Now has a bit of secondary infection with some cellulitis around the toe. Some purulent discharge under the toenails. Right toenail is loose. Good cap refill dorsalis pedis pulse 2+. Will treat with doxycycline soaking in warm water for 20 minutes twice a day. Follow-up with podiatry or her regular doctor.    Vanetta MuldersScott Yvaine Jankowiak, MD 06/25/15 [phone]

## 2015-06-25 NOTE — ED Notes (Signed)
States she wants her toe nails cut. States she needs an antibiotic for ingrown toe nails. Hx of neuropathy. Her MD could not see her.

## 2015-10-25 ENCOUNTER — Encounter (HOSPITAL_BASED_OUTPATIENT_CLINIC_OR_DEPARTMENT_OTHER): Payer: Self-pay | Admitting: *Deleted

## 2015-10-25 ENCOUNTER — Emergency Department (HOSPITAL_BASED_OUTPATIENT_CLINIC_OR_DEPARTMENT_OTHER)
Admission: EM | Admit: 2015-10-25 | Discharge: 2015-10-25 | Disposition: A | Discharge status: Home / Self Care | Payer: Self-pay | Attending: Emergency Medicine | Admitting: Emergency Medicine

## 2015-10-25 DIAGNOSIS — M549 Dorsalgia, unspecified: Secondary | ICD-10-CM

## 2015-10-25 DIAGNOSIS — Z79891 Long term (current) use of opiate analgesic: Secondary | ICD-10-CM | POA: Insufficient documentation

## 2015-10-25 DIAGNOSIS — Z79899 Other long term (current) drug therapy: Secondary | ICD-10-CM | POA: Insufficient documentation

## 2015-10-25 DIAGNOSIS — F329 Major depressive disorder, single episode, unspecified: Secondary | ICD-10-CM | POA: Insufficient documentation

## 2015-10-25 DIAGNOSIS — M5442 Lumbago with sciatica, left side: Secondary | ICD-10-CM | POA: Insufficient documentation

## 2015-10-25 DIAGNOSIS — M546 Pain in thoracic spine: Secondary | ICD-10-CM | POA: Insufficient documentation

## 2015-10-25 LAB — URINALYSIS, ROUTINE W REFLEX MICROSCOPIC
BILIRUBIN URINE: NEGATIVE
Glucose, UA: NEGATIVE mg/dL
Hgb urine dipstick: NEGATIVE
Ketones, ur: NEGATIVE mg/dL
Leukocytes, UA: NEGATIVE
Nitrite: NEGATIVE
PH: 6.5 (ref 5.0–8.0)
Protein, ur: NEGATIVE mg/dL
SPECIFIC GRAVITY, URINE: 1.013 (ref 1.005–1.030)

## 2015-10-25 LAB — PREGNANCY, URINE: Preg Test, Ur: NEGATIVE

## 2015-10-25 MED ORDER — ONDANSETRON 4 MG PO TBDP
4.0000 mg | ORAL_TABLET | Freq: Once | ORAL | Status: AC
  Administered 2015-10-25: 4 mg via ORAL
  Filled 2015-10-25: qty 1

## 2015-10-25 MED ORDER — METHOCARBAMOL 500 MG PO TABS: 1000.0000 mg | ORAL_TABLET | Freq: Four times a day (QID) | ORAL | Status: DC

## 2015-10-25 MED ORDER — KETOROLAC TROMETHAMINE 60 MG/2ML IM SOLN
60.0000 mg | Freq: Once | INTRAMUSCULAR | Status: AC
  Administered 2015-10-25: 60 mg via INTRAMUSCULAR
  Filled 2015-10-25: qty 2

## 2015-10-25 MED ORDER — MORPHINE SULFATE (PF) 4 MG/ML IV SOLN
4.0000 mg | Freq: Once | INTRAVENOUS | Status: AC
  Administered 2015-10-25: 4 mg via INTRAMUSCULAR
  Filled 2015-10-25: qty 1

## 2015-10-25 MED ORDER — HYDROCODONE-ACETAMINOPHEN 5-325 MG PO TABS: ORAL_TABLET | ORAL | Status: DC

## 2015-10-25 MED ORDER — HYDROCODONE-ACETAMINOPHEN 5-325 MG PO TABS
1.0000 | ORAL_TABLET | Freq: Once | ORAL | Status: AC
  Administered 2015-10-25: 1 via ORAL
  Filled 2015-10-25: qty 1

## 2015-10-25 NOTE — Discharge Instructions (Signed)
Please read and follow all provided instructions.  Your diagnoses today include:  1. Left-sided low back pain with left-sided sciatica   2. Upper back pain on right side     Tests performed today include:  Vital signs - see below for your results today  Medications prescribed:   Vicodin (hydrocodone/acetaminophen) - narcotic pain medication  DO NOT drive or perform any activities that require you to be awake and alert because this medicine can make you drowsy. BE VERY CAREFUL not to take multiple medicines containing Tylenol (also called acetaminophen). Doing so can lead to an overdose which can damage your liver and cause liver failure and possibly death.   Robaxin (methocarbamol) - muscle relaxer medication  DO NOT drive or perform any activities that require you to be awake and alert because this medicine can make you drowsy.   Take any prescribed medications only as directed.  Home care instructions:   Follow any educational materials contained in this packet  Please rest, use ice or heat on your back for the next several days  Do not lift, push, pull anything more than 10 pounds for the next week  Follow-up instructions: Please follow-up with your primary care provider in the next 1 week for further evaluation of your symptoms.   Return instructions:  SEEK IMMEDIATE MEDICAL ATTENTION IF YOU HAVE:  New numbness, tingling, weakness, or problem with the use of your arms or legs  Severe back pain not relieved with medications  Loss control of your bowels or bladder  Increasing pain in any areas of the body (such as chest or abdominal pain)  Shortness of breath, dizziness, or fainting.   Worsening nausea (feeling sick to your stomach), vomiting, fever, or sweats  Any other emergent concerns regarding your health   Additional Information:  Your vital signs today were: BP 104/77 mmHg   Pulse 94   Resp 18   Ht 5\' 3"  (1.6 m)   Wt 97.977 kg   BMI 38.27 kg/m2   SpO2  100% If your blood pressure (BP) was elevated above 135/85 this visit, please have this repeated by your doctor within one month. --------------

## 2015-10-25 NOTE — ED Notes (Signed)
Patient c/o lower back/neck pain that has grown worse over the past two days. Took ibuprofen & tylenol but no relief

## 2015-10-25 NOTE — ED Provider Notes (Signed)
CSN: [id number]     Arrival date & time 10/25/15  1007 History   First MD Initiated Contact with Patient 10/25/15 1010     Chief Complaint  Patient presents with  . 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 No injury at onset. Patient has taken ibuprofen, Tylenol and applied warm compresses and ice without relief. Patient denies warning symptoms of back pain including: fecal incontinence, urinary retention or overflow incontinence, night sweats, waking from sleep with back pain, unexplained fevers or weight loss, h/o cancer, IVDU, recent trauma.     Patient is a 35 y.o. female presenting with back pain. The history is provided by the patient.  Back Pain Associated symptoms: no dysuria, no fever, no numbness, no pelvic pain and no weakness     Past Medical History  Diagnosis Date  . Nerve damage   . Depression   . Diverticulitis    Past Surgical History  Procedure Laterality Date  . Cholecystectomy    . Cesarean section    . Sacral nerve stimulator placement    . Appendectomy     No family history on file. Social History  Substance Use Topics  . Smoking status: Never Smoker   . Smokeless tobacco: None  . Alcohol Use: Yes     Comment: social   OB History    No data available     Review of Systems  Constitutional: Negative for fever and unexpected weight change.  Gastrointestinal: Negative for constipation.       Negative for fecal incontinence.   Genitourinary: Negative for dysuria, hematuria, flank pain, vaginal bleeding, vaginal discharge and pelvic pain.       Negative for urinary incontinence or retention.  Musculoskeletal: Positive for back pain and neck pain.    Neurological: Negative for weakness and numbness.       Denies saddle paresthesias.    Allergies  Amoxicillin-pot clavulanate; Vigamox; Latex; and Dilaudid  Home Medications   Prior to Admission medications   Medication Sig Start Date End Date Taking? Authorizing Provider  gabapentin (NEURONTIN) 300 MG capsule Take 300 mg by mouth 3 (three) times daily.   Yes Historical Provider, MD  lamoTRIgine (LAMICTAL) 200 MG tablet Take 200 mg by mouth daily.   Yes Historical Provider, MD  venlafaxine (EFFEXOR) 25 MG tablet Take 25 mg by mouth 2 (two) times daily.   Yes Historical Provider, MD  amitriptyline (ELAVIL) 150 MG tablet Take 150 mg by mouth at bedtime.      Historical Provider, MD  baclofen (LIORESAL) 10 MG tablet Take 10 mg by mouth 2 (two) times daily.     Historical Provider, MD  doxycycline (VIBRAMYCIN) 100 MG capsule Take 1 capsule (100 mg total) by mouth 2 (two) times daily. 06/25/15   Vanetta Mulders, MD  FLUoxetine (PROZAC) 20 MG capsule Take 20 mg by mouth daily.    Historical Provider, MD  HYDROcodone-acetaminophen (NORCO/VICODIN) 5-325 MG per tablet Take 1 tablet by mouth every 4 (four) hours as needed for moderate pain. 09/16/14   Azalia Bilis, MD  HYDROcodone-acetaminophen (NORCO/VICODIN) 5-325 MG per tablet Take 1-2 tablets by mouth every 6 (six) hours as needed for moderate pain. 09/25/14   Vanetta Mulders, MD  ibuprofen (ADVIL,MOTRIN) 600 MG tablet Take 1 tablet (600  mg total) by mouth every 8 (eight) hours as needed. 09/16/14   Azalia Bilis, MD  metroNIDAZOLE (FLAGYL) 500 MG tablet Take 1 tablet (500 mg total) by mouth 2 (two) times daily. 09/16/14   Azalia Bilis, MD  pregabalin (LYRICA) 100 MG capsule Take 100 mg by mouth 3 (three) times daily.    Historical Provider, MD  promethazine (PHENERGAN) 25 MG tablet Take 1 tablet (25 mg total) by mouth every 6 (six) hours as needed for nausea or vomiting. 09/16/14   Azalia Bilis, MD  promethazine (PHENERGAN) 25 MG tablet Take 1 tablet (25  mg total) by mouth every 6 (six) hours as needed for nausea or vomiting. 09/25/14   Vanetta Mulders, MD  topiramate (TOPAMAX) 25 MG capsule Take 25 mg by mouth 4 (four) times daily.    Historical Provider, MD  Vitamin D, Ergocalciferol, (DRISDOL) 50000 UNITS CAPS capsule Take 50,000 Units by mouth every 7 (seven) days.    Historical Provider, MD   Pulse 89  Resp 18  Ht 5\' 3"  (1.6 m)  Wt 97.977 kg  BMI 38.27 kg/m2  SpO2 100%   Physical Exam  Constitutional: She appears well-developed and well-nourished.  HENT:  Head: Normocephalic and atraumatic.  Eyes: Conjunctivae are normal.  Neck: Normal range of motion. Neck supple.  Pulmonary/Chest: Effort normal.  Abdominal: Soft. There is no tenderness. There is no CVA tenderness.  Musculoskeletal: Normal range of motion.       Cervical back: She exhibits tenderness and spasm. She exhibits normal range of motion and no bony tenderness.       Thoracic back: She exhibits normal range of motion, no tenderness and no bony tenderness.       Lumbar back: She exhibits tenderness, pain and spasm. She exhibits normal range of motion, no bony tenderness and no swelling.       Back:  No step-off noted with palpation of spine.   Neurological: She is alert. She has normal strength and normal reflexes. No sensory deficit.  5/5 strength in entire lower extremities bilaterally. No sensation deficit.   Skin: Skin is warm and dry. No rash noted.  Psychiatric: She has a normal mood and affect.  Nursing note and vitals reviewed.   ED Course  Procedures (including critical care time) Labs Review Labs Reviewed  URINALYSIS, ROUTINE W REFLEX MICROSCOPIC (NOT AT [facility])  PREGNANCY, URINE    11:34 AM Patient seen and examined. UA/urine preg neg. Medications ordered.   Vital signs reviewed and are as follows: Filed Vitals:   10/25/15 1011 10/25/15 1331  BP:  104/77  Pulse: 89 94  Resp: 18 18   1:49 PM Patient improved. She has been up in the department.    No red flag s/s of low back pain. Patient was counseled on back pain precautions and told to do activity as tolerated but do not lift, push, or pull heavy objects more than 10 pounds for the next week.  Patient counseled to use ice or heat on back for no longer than 15 minutes every hour.   Patient prescribed muscle relaxer and counseled on proper use of muscle relaxant medication.    Patient prescribed narcotic pain medicine and counseled on proper use of narcotic pain medications. Counseled not to combine this medication with others containing tylenol.   Urged patient not to drink alcohol, drive, or perform any other activities that requires focus while taking either of these medications.  Patient urged to follow-up with PCP if pain does not improve  with treatment and rest or if pain becomes recurrent. Urged to return with worsening severe pain, loss of bowel or bladder control, trouble walking.   The patient verbalizes understanding and agrees with the plan.   MDM   Final diagnoses:  Left-sided low back pain with left-sided sciatica  Upper back pain on right side   Patient with back pain. No neurological deficits. Patient is ambulatory. No warning symptoms of back pain including: fecal incontinence, urinary retention or overflow incontinence, night sweats, waking from sleep with back pain, unexplained fevers or weight loss, h/o cancer, IVDU, recent trauma. No concern for cauda equina, epidural abscess, or other serious cause of back pain. Conservative measures such as rest, ice/heat and pain medicine indicated with PCP follow-up if no improvement with conservative management.       Renne CriglerJoshua Felicidad Sugarman, PA-C 10/25/15 1350  Alvira MondayErin Schlossman, MD 10/25/15 2213

## 2016-03-06 ENCOUNTER — Encounter (HOSPITAL_BASED_OUTPATIENT_CLINIC_OR_DEPARTMENT_OTHER): Payer: Self-pay | Admitting: *Deleted

## 2016-03-06 ENCOUNTER — Emergency Department (HOSPITAL_BASED_OUTPATIENT_CLINIC_OR_DEPARTMENT_OTHER)
Admission: EM | Admit: 2016-03-06 | Discharge: 2016-03-06 | Disposition: A | Discharge status: Home / Self Care | Payer: Self-pay | Attending: Emergency Medicine | Admitting: Emergency Medicine

## 2016-03-06 DIAGNOSIS — B9789 Other viral agents as the cause of diseases classified elsewhere: Secondary | ICD-10-CM

## 2016-03-06 DIAGNOSIS — J069 Acute upper respiratory infection, unspecified: Secondary | ICD-10-CM | POA: Insufficient documentation

## 2016-03-06 HISTORY — DX: Fibromyalgia: M79.7

## 2016-03-06 HISTORY — DX: Polyneuropathy, unspecified: G62.9

## 2016-03-06 MED ORDER — MAGIC MOUTHWASH W/LIDOCAINE: 5.0000 mL | Freq: Four times a day (QID) | ORAL | 0 refills | Status: DC | PRN

## 2016-03-06 MED ORDER — GI COCKTAIL ~~LOC~~
30.0000 mL | Freq: Once | ORAL | Status: AC
  Administered 2016-03-06: 30 mL via ORAL
  Filled 2016-03-06: qty 30

## 2016-03-06 NOTE — ED Triage Notes (Signed)
C/o sore throat that started on Saturday. C/o chills. Fevers unknown. States prod cough green in color. Denies any sob. No other complaints

## 2016-03-06 NOTE — ED Provider Notes (Signed)
[facility] Provider Note   CSN: [id number] Arrival date & time: 03/06/16  [id number]     History   Chief Complaint Chief Complaint  Patient presents with  . Sore Throat    HPI [name] is a 35 y.o. female.  The history is provided by the patient.  Sore Throat  This is a new problem. The current episode started yesterday. The problem occurs constantly. The problem has been gradually worsening. Pertinent negatives include no headaches and no shortness of breath. Associated symptoms comments: Cough, nasal congestion, sinus drainage, chills. The symptoms are aggravated by swallowing. Nothing relieves the symptoms. Treatments tried: otc meds and lidocaine spray. The treatment provided no relief.    Past Medical History:  Diagnosis Date  . Depression   . Diverticulitis   . Fibromyalgia   . Nerve damage   . Neuropathy (HCC)     There are no active problems to display for this patient.   Past Surgical History:  Procedure Laterality Date  . APPENDECTOMY    . CESAREAN SECTION    . CHOLECYSTECTOMY    . SACRAL NERVE STIMULATOR PLACEMENT    . TONSILLECTOMY      OB History    No data available       Home Medications    Prior to Admission medications   Medication Sig Start Date End Date Taking? Authorizing Provider  amitriptyline (ELAVIL) 150 MG tablet Take 150 mg by mouth at bedtime.      Historical Provider, MD  baclofen (LIORESAL) 10 MG tablet Take 10 mg by mouth 2 (two) times daily.     Historical Provider, MD  gabapentin (NEURONTIN) 300 MG capsule Take 300 mg by mouth 3 (three) times daily.    Historical Provider, MD  HYDROcodone-acetaminophen (NORCO/VICODIN) 5-325 MG tablet Take 1-2 tablets every 6 hours as needed for severe pain 10/25/15   Renne CriglerJoshua Geiple, PA-C  ibuprofen (ADVIL,MOTRIN) 600 MG tablet Take 1 tablet (600 mg total) by mouth every 8 (eight) hours as needed. 09/16/14   Azalia BilisKevin Campos, MD  lamoTRIgine (LAMICTAL) 200 MG tablet Take 200 mg by mouth  daily.    Historical Provider, MD  methocarbamol (ROBAXIN) 500 MG tablet Take 2 tablets (1,000 mg total) by mouth 4 (four) times daily. 10/25/15   Renne CriglerJoshua Geiple, PA-C  venlafaxine (EFFEXOR) 25 MG tablet Take 25 mg by mouth 2 (two) times daily.    Historical Provider, MD    Family History No family history on file.  Social History Social History  Substance Use Topics  . Smoking status: Never Smoker  . Smokeless tobacco: Never Used  . Alcohol use Yes     Comment: social     Allergies   Amoxicillin-pot clavulanate; Vigamox [moxifloxacin hydrochloride]; Latex; and Dilaudid [hydromorphone hcl]   Review of Systems Review of Systems  Respiratory: Negative for shortness of breath.   Neurological: Negative for headaches.  All other systems reviewed and are negative.    Physical Exam Updated Vital Signs BP 129/87 (BP Location: Left Arm)   Pulse 98   Temp 98.7 F (37.1 C) (Oral)   Resp 18   Ht 5\' 3"  (1.6 m)   Wt 216 lb (98 kg)   LMP 02/29/2016 (Approximate)   SpO2 98%   BMI 38.26 kg/m   Physical Exam  Constitutional: She is oriented to person, place, and time. She appears well-developed and well-nourished. No distress.  HENT:  Head: Normocephalic and atraumatic.  Mouth/Throat: Posterior oropharyngeal erythema present. No oropharyngeal exudate or  posterior oropharyngeal edema.  No trismus or muffled voice  Eyes: Conjunctivae and EOM are normal. Pupils are equal, round, and reactive to light.  Neck: Normal range of motion. Neck supple.  Cardiovascular: Normal rate, regular rhythm and intact distal pulses.   No murmur heard. Pulmonary/Chest: Effort normal and breath sounds normal. No respiratory distress. She has no wheezes. She has no rales.  Abdominal: Soft. She exhibits no distension. There is no tenderness. There is no rebound and no guarding.  Musculoskeletal: Normal range of motion. She exhibits no edema or tenderness.  Lymphadenopathy:    She has no cervical  adenopathy.  Neurological: She is alert and oriented to person, place, and time.  Skin: Skin is warm and dry. No rash noted. No erythema.  Psychiatric: She has a normal mood and affect. Her behavior is normal.  Nursing note and vitals reviewed.    ED Treatments / Results  Labs (all labs ordered are listed, but only abnormal results are displayed) Labs Reviewed - No data to display  EKG  EKG Interpretation None       Radiology No results found.  Procedures Procedures (including critical care time)  Medications Ordered in ED Medications  gi cocktail (Maalox,Lidocaine,Donnatal) (not administered)     Initial Impression / Assessment and Plan / ED Course  I have reviewed the triage vital signs and the nursing notes.  Pertinent labs & imaging results that were available during my care of the patient were reviewed by me and considered in my medical decision making (see chart for details).  Clinical Course     Pt with symptoms consistent with viral URI with pharyngitis.  Well appearing here.  No signs of breathing difficulty  No signs of  otitis or abnormal abdominal findings.      Final Clinical Impressions(s) / ED Diagnoses   Final diagnoses:  Viral URI with cough    New Prescriptions New Prescriptions   MAGIC MOUTHWASH W/LIDOCAINE SOLN    Take 5 mLs by mouth 4 (four) times daily as needed for mouth pain (throat pain).     Gwyneth SproutWhitney Herron Fero, MD 03/06/16 [phone]

## 2016-03-06 NOTE — ED Notes (Signed)
ED Provider at bedside. 

## 2016-05-06 ENCOUNTER — Emergency Department (HOSPITAL_COMMUNITY): Payer: Self-pay

## 2016-05-06 ENCOUNTER — Emergency Department (HOSPITAL_COMMUNITY)
Admission: EM | Admit: 2016-05-06 | Discharge: 2016-05-06 | Disposition: A | Discharge status: Home / Self Care | Payer: Self-pay | Attending: Emergency Medicine | Admitting: Emergency Medicine

## 2016-05-06 ENCOUNTER — Encounter (HOSPITAL_COMMUNITY): Payer: Self-pay | Admitting: *Deleted

## 2016-05-06 DIAGNOSIS — R55 Syncope and collapse: Secondary | ICD-10-CM

## 2016-05-06 DIAGNOSIS — Y939 Activity, unspecified: Secondary | ICD-10-CM | POA: Insufficient documentation

## 2016-05-06 DIAGNOSIS — M25551 Pain in right hip: Secondary | ICD-10-CM | POA: Insufficient documentation

## 2016-05-06 DIAGNOSIS — Z91040 Latex allergy status: Secondary | ICD-10-CM | POA: Insufficient documentation

## 2016-05-06 DIAGNOSIS — I951 Orthostatic hypotension: Secondary | ICD-10-CM | POA: Insufficient documentation

## 2016-05-06 DIAGNOSIS — W1839XA Other fall on same level, initial encounter: Secondary | ICD-10-CM | POA: Insufficient documentation

## 2016-05-06 DIAGNOSIS — Y999 Unspecified external cause status: Secondary | ICD-10-CM | POA: Insufficient documentation

## 2016-05-06 DIAGNOSIS — M545 Low back pain: Secondary | ICD-10-CM | POA: Insufficient documentation

## 2016-05-06 DIAGNOSIS — Y929 Unspecified place or not applicable: Secondary | ICD-10-CM | POA: Insufficient documentation

## 2016-05-06 DIAGNOSIS — M546 Pain in thoracic spine: Secondary | ICD-10-CM | POA: Insufficient documentation

## 2016-05-06 LAB — CBC
HCT: 37.2 % (ref 36.0–46.0)
Hemoglobin: 13 g/dL (ref 12.0–15.0)
MCH: 28 pg (ref 26.0–34.0)
MCHC: 34.9 g/dL (ref 30.0–36.0)
MCV: 80 fL (ref 78.0–100.0)
PLATELETS: 251 10*3/uL (ref 150–400)
RBC: 4.65 MIL/uL (ref 3.87–5.11)
RDW: 13.3 % (ref 11.5–15.5)
WBC: 9.5 10*3/uL (ref 4.0–10.5)

## 2016-05-06 LAB — I-STAT BETA HCG BLOOD, ED (MC, WL, AP ONLY): I-stat hCG, quantitative: <5 m[IU]/mL (ref <5)

## 2016-05-06 LAB — BASIC METABOLIC PANEL
Anion gap: 7 (ref 5–15)
BUN: 7 mg/dL (ref 6–20)
CALCIUM: 8.8 mg/dL — AB (ref 8.9–10.3)
CO2: 25 mmol/L (ref 22–32)
CREATININE: 0.79 mg/dL (ref 0.44–1.00)
Chloride: 109 mmol/L (ref 101–111)
GFR calc Af Amer: >60 mL/min (ref >60)
GFR calc non Af Amer: >60 mL/min (ref >60)
GLUCOSE: 84 mg/dL (ref 65–99)
Potassium: 3.4 mmol/L — ABNORMAL LOW (ref 3.5–5.1)
Sodium: 141 mmol/L (ref 135–145)

## 2016-05-06 LAB — CBG MONITORING, ED: GLUCOSE-CAPILLARY: 74 mg/dL (ref 65–99)

## 2016-05-06 MED ORDER — ACETAMINOPHEN 500 MG PO TABS
1000.0000 mg | ORAL_TABLET | Freq: Once | ORAL | Status: AC
  Administered 2016-05-06: 1000 mg via ORAL
  Filled 2016-05-06: qty 2

## 2016-05-06 MED ORDER — SODIUM CHLORIDE 0.9 % IV BOLUS (SEPSIS)
1000.0000 mL | Freq: Once | INTRAVENOUS | Status: AC
  Administered 2016-05-06: 1000 mL via INTRAVENOUS

## 2016-05-06 MED ORDER — FENTANYL CITRATE (PF) 100 MCG/2ML IJ SOLN
50.0000 ug | Freq: Once | INTRAMUSCULAR | Status: AC
  Administered 2016-05-06: 50 ug via INTRAVENOUS
  Filled 2016-05-06: qty 2

## 2016-05-06 MED ORDER — IBUPROFEN 800 MG PO TABS
800.0000 mg | ORAL_TABLET | Freq: Once | ORAL | Status: AC
  Administered 2016-05-06: 800 mg via ORAL
  Filled 2016-05-06: qty 1

## 2016-05-06 NOTE — ED Notes (Signed)
Pt requesting for something to eat and drink.  [facility]

## 2016-05-06 NOTE — ED Triage Notes (Signed)
Per EMS. Pt fell while she was at home.  She reports syncope after getting OOB today.  Woke up on the floor.  Pt is A&Ox 4.  Pt also reports her head was on the plastic foot step beside her bed.  Pt reports neck and back pain.  Pt has hx of neuropathy and is normally numb from bila knees down.  Today, she reports numbness in bila thighs after the fall.

## 2016-05-06 NOTE — ED Provider Notes (Signed)
[facility]-EMERGENCY DEPT Provider Note   CSN: [id number] Arrival date & time: 05/06/16  1246     History   Chief Complaint Chief Complaint  Patient presents with  . Loss of Consciousness    HPI [name] is a 36 y.o. female.  HPI [name] is a 36 y.o. female with PMH significant for depression, diverticulitis, FM, polyneuropathy who presents with syncope just PTA.  Patient reports she got up out of bed and then blacked out, hitting her head and neck on plastic pet steps next to her bed.  No CP, SOB, dizziness.  Endorses LOC, but unsure how long she was down.  No witnesses. She now complains of neck, back, and right hip pain.  She endorses chronic numbness of bilateral arms and lower extremities.  This is unchanged.  This has never happened before.  She reports history of fall and syncope, but "never at the same time".   Past Medical History:  Diagnosis Date  . Depression   . Diverticulitis   . Fibromyalgia   . Nerve damage   . Neuropathy (HCC)     There are no active problems to display for this patient.   Past Surgical History:  Procedure Laterality Date  . APPENDECTOMY    . CESAREAN SECTION    . CHOLECYSTECTOMY    . SACRAL NERVE STIMULATOR PLACEMENT    . TONSILLECTOMY      OB History    No data available       Home Medications    Prior to Admission medications   Medication Sig Start Date End Date Taking? Authorizing Provider  amitriptyline (ELAVIL) 150 MG tablet Take 150 mg by mouth at bedtime.     Yes Historical Provider, MD  gabapentin (NEURONTIN) 300 MG capsule Take 600 mg by mouth 3 (three) times daily.    Yes Historical Provider, MD  lamoTRIgine (LAMICTAL) 200 MG tablet Take 200 mg by mouth 2 (two) times daily.    Yes Historical Provider, MD  prazosin (MINIPRESS) 1 MG capsule Take 1 mg by mouth at bedtime. 04/23/16  Yes Historical Provider, MD  TURMERIC PO Take 1 tablet by mouth daily.   Yes Historical Provider, MD  venlafaxine (EFFEXOR) 50 MG  tablet Take 100 mg by mouth daily.   Yes Historical Provider, MD  HYDROcodone-acetaminophen (NORCO/VICODIN) 5-325 MG tablet Take 1-2 tablets every 6 hours as needed for severe pain Patient not taking: Reported on 05/06/2016 10/25/15   Renee Crigler, PA-C  ibuprofen (ADVIL,MOTRIN) 600 MG tablet Take 1 tablet (600 mg total) by mouth every 8 (eight) hours as needed. Patient not taking: Reported on 05/06/2016 09/16/14   Azalia Bilis, MD  magic mouthwash w/lidocaine SOLN Take 5 mLs by mouth 4 (four) times daily as needed for mouth pain (throat pain). Patient not taking: Reported on 05/06/2016 03/06/16   Gwyneth Sprout, MD  methocarbamol (ROBAXIN) 500 MG tablet Take 2 tablets (1,000 mg total) by mouth 4 (four) times daily. Patient not taking: Reported on 05/06/2016 10/25/15   Renee Crigler, PA-C    Family History No family history on file.  Social History Social History  Substance Use Topics  . Smoking status: Never Smoker  . Smokeless tobacco: Never Used  . Alcohol use Yes     Comment: social     Allergies   Amoxicillin-pot clavulanate; Vigamox [moxifloxacin hydrochloride]; Latex; Dilaudid [hydromorphone hcl]; and Gluten meal   Review of Systems Review of Systems All other systems negative unless otherwise stated in HPI  Physical Exam Updated Vital Signs BP 141/79   Pulse 81   Temp 98.3 F (36.8 C) (Oral)   Resp 16   LMP 04/26/2016 (Exact Date) Comment: negative HCG blood test 05-06-2016  SpO2 96%   Physical Exam  Constitutional: She is oriented to person, place, and time. She appears well-developed and well-nourished.  Non-toxic appearance. She does not have a sickly appearance. She does not appear ill. Cervical collar in place.  HENT:  Head: Normocephalic and atraumatic.  Mouth/Throat: Oropharynx is clear and moist.  Eyes: Conjunctivae are normal. Pupils are equal, round, and reactive to light.  Neck: Normal range of motion. Neck supple.  Cervical midline tenderness.     Cardiovascular: Normal rate and regular rhythm.   Pulmonary/Chest: Effort normal and breath sounds normal. No accessory muscle usage or stridor. No respiratory distress. She has no wheezes. She has no rhonchi. She has no rales.  Abdominal: Soft. Bowel sounds are normal. She exhibits no distension. There is no tenderness.  Musculoskeletal: Normal range of motion. She exhibits tenderness.  Diffuse tenderness of thoracic and lumbar spine. Right hip tenderness. Right lower extremity with short leg cast.   Lymphadenopathy:    She has no cervical adenopathy.  Neurological: She is alert and oriented to person, place, and time.  Cranial nerves grossly intact. Normal finger to nose. Normal strength throughout upper and lower extremities.  Reported decreased sensation to bilateral lower extremities, chronic.   Skin: Skin is warm and dry.  Psychiatric: She has a normal mood and affect. Her behavior is normal.     ED Treatments / Results  Labs (all labs ordered are listed, but only abnormal results are displayed) Labs Reviewed  BASIC METABOLIC PANEL - Abnormal; Notable for the following:       Result Value   Potassium 3.4 (*)    Calcium 8.8 (*)    All other components within normal limits  CBC  URINALYSIS, ROUTINE W REFLEX MICROSCOPIC  CBG MONITORING, ED  I-STAT BETA HCG BLOOD, ED ([facility], [facility], [facility] ONLY)    EKG  EKG Interpretation  Date/Time:  Friday May 06 2016 13:33:44 EST Ventricular Rate:  77 PR Interval:    QRS Duration: 109 QT Interval:  411 QTC Calculation: 466 R Axis:   43 Text Interpretation:  Sinus rhythm RSR' in V1 or V2, right VCD or RVH no significant change since Jan 2017 Confirmed by Criss Alvine MD, SCOTT [phone]) on 05/06/2016 2:41:51 PM       Radiology Dg Thoracic Spine 2 View  Result Date: 05/06/2016 CLINICAL DATA:  Fall. EXAM: THORACIC SPINE 2 VIEWS COMPARISON:  06/22/2009. FINDINGS: Neurostimulator noted with lead tips in the posterior thoracic spinal canal. Mild  scoliosis and degenerative change. No prominent compression fracture. IMPRESSION: Neurostimulator noted. Mild scoliosis and degenerative change. No prominent compression fracture or other focal abnormality identified. Electronically Signed   By: Maisie Fus  Register   On: 05/06/2016 15:23   Dg Lumbar Spine Complete  Result Date: 05/06/2016 CLINICAL DATA:  Fall. EXAM: LUMBAR SPINE - COMPLETE 4+ VIEW FINDINGS: Neurostimulator noted with lead tips over the thoracic spinal canal. No acute bony abnormality identified. No fracture . Diffuse mild degenerative change. IMPRESSION: Diffuse mild degenerative change.  No acute abnormality identified. Electronically Signed   By: Maisie Fus  Register   On: 05/06/2016 15:25   Ct Head Wo Contrast  Result Date: 05/06/2016 CLINICAL DATA:  Syncopal episode at home today. Head trauma with right-sided neck pain. Headache. EXAM: CT HEAD WITHOUT CONTRAST CT CERVICAL SPINE WITHOUT  CONTRAST TECHNIQUE: Multidetector CT imaging of the head and cervical spine was performed following the standard protocol without intravenous contrast. Multiplanar CT image reconstructions of the cervical spine were also generated. COMPARISON:  CT scan dated 05/18/2015 and 10/03/2011 and 12/06/2009 FINDINGS: CT HEAD FINDINGS Brain: No evidence of acute infarction, hemorrhage, hydrocephalus, extra-axial collection or mass lesion/mass effect. Slight asymmetry of the lateral ventricles is most likely congenital. Is unchanged. Brain parenchyma is normal. Vascular: No hyperdense vessel or unexpected calcification. Skull: Normal. Negative for fracture or focal lesion. Sinuses/Orbits: No acute finding. Other: None CT CERVICAL SPINE FINDINGS Alignment: Normal. Skull base and vertebrae: No acute fracture. No primary bone lesion or focal pathologic process. Soft tissues and spinal canal: No prevertebral fluid or swelling. No visible canal hematoma. 2 small nodules in the thyroid gland, unchanged since 12/06/2009 Disc  levels:  Normal. Upper chest: Normal. Other: None IMPRESSION: 1. Negative CT scan of the head. 2. Negative CT scan of the cervical spine. Stable thyroid nodules since 2011. Electronically Signed   By: Francene BoyersJames  Maxwell M.D.   On: 05/06/2016 15:02   Ct Cervical Spine Wo Contrast  Result Date: 05/06/2016 CLINICAL DATA:  Syncopal episode at home today. Head trauma with right-sided neck pain. Headache. EXAM: CT HEAD WITHOUT CONTRAST CT CERVICAL SPINE WITHOUT CONTRAST TECHNIQUE: Multidetector CT imaging of the head and cervical spine was performed following the standard protocol without intravenous contrast. Multiplanar CT image reconstructions of the cervical spine were also generated. COMPARISON:  CT scan dated 05/18/2015 and 10/03/2011 and 12/06/2009 FINDINGS: CT HEAD FINDINGS Brain: No evidence of acute infarction, hemorrhage, hydrocephalus, extra-axial collection or mass lesion/mass effect. Slight asymmetry of the lateral ventricles is most likely congenital. Is unchanged. Brain parenchyma is normal. Vascular: No hyperdense vessel or unexpected calcification. Skull: Normal. Negative for fracture or focal lesion. Sinuses/Orbits: No acute finding. Other: None CT CERVICAL SPINE FINDINGS Alignment: Normal. Skull base and vertebrae: No acute fracture. No primary bone lesion or focal pathologic process. Soft tissues and spinal canal: No prevertebral fluid or swelling. No visible canal hematoma. 2 small nodules in the thyroid gland, unchanged since 12/06/2009 Disc levels:  Normal. Upper chest: Normal. Other: None IMPRESSION: 1. Negative CT scan of the head. 2. Negative CT scan of the cervical spine. Stable thyroid nodules since 2011. Electronically Signed   By: Francene BoyersJames  Maxwell M.D.   On: 05/06/2016 15:02   Dg Hips Bilat W Or Wo Pelvis 3-4 Views  Result Date: 05/06/2016 CLINICAL DATA:  Fall this morning after getting out of bed and losing consciousness. Mid to lower back pain and right greater than left hip pain.  Initial encounter. EXAM: DG HIP (WITH OR WITHOUT PELVIS) 3-4V BILAT COMPARISON:  CT abdomen and pelvis 09/25/2014. Right femur radiographs 07/03/2009. FINDINGS: There is no evidence of acute fracture or dislocation. Hip joint space widths are preserved bilaterally. A spinal stimulator is partially visualized. IMPRESSION: No evidence of acute osseous abnormality. Electronically Signed   By: Sebastian AcheAllen  Grady M.D.   On: 05/06/2016 15:25    Procedures Procedures (including critical care time)  Medications Ordered in ED Medications  fentaNYL (SUBLIMAZE) injection 50 mcg (50 mcg Intravenous Given 05/06/16 1411)  sodium chloride 0.9 % bolus 1,000 mL (1,000 mLs Intravenous New Bag/Given 05/06/16 1611)  ibuprofen (ADVIL,MOTRIN) tablet 800 mg (800 mg Oral Given 05/06/16 1713)  acetaminophen (TYLENOL) tablet 1,000 mg (1,000 mg Oral Given 05/06/16 1713)     Initial Impression / Assessment and Plan / ED Course  I have reviewed the triage vital  signs and the nursing notes.  Pertinent labs & imaging results that were available during my care of the patient were reviewed by me and considered in my medical decision making (see chart for details).   Patient presents for syncope while going from lying down to standing.  No preceding CP, SOB, or dizziness.  Now with back pain. VSS.  No focal neurological deficits on exam.  Diffuse c/t/l midline tenderness and right hip tenderness.  No signs of trauma.  EKG without acute changes.  Labs without acute abnormalities.  Imaging unremarkable.  She is not pregnant.  She is found to be orthostatic, she received 1L NS.  Able to ambulate in ED.  Low risk SF syncope rule.  EKG without life threatening arrhythmia. Suspect orthostatic component =/- minipress.  Follow up with PCP.  Return precautions discussed.  Stable for discharge.   Case has been discussed with and seen by Dr. Criss Alvine  who agrees with the above plan for discharge.   Final Clinical Impressions(s) / ED Diagnoses     Final diagnoses:  Syncope, unspecified syncope type  Orthostatic hypotension    New Prescriptions New Prescriptions   No medications on file     Cheri Fowler, PA-C 05/06/16 1804    Pricilla Loveless, MD 05/12/16 2308

## 2016-05-06 NOTE — Discharge Instructions (Signed)
Your imaging, EKG, and lab work is reassuring.  Your syncopal event could be related to Minipress.  Please follow up with your primary care doctor regarding those dose.  Drink plenty of fluids.  Return to the ED for any new or concerning symptoms.

## 2016-05-06 NOTE — ED Notes (Signed)
Patient d/c'd self care.  F/U reviewed.  Patient verbalized understanding. 

## 2016-05-06 NOTE — ED Notes (Signed)
Pt reports she ambulates with a knee scooter, pt presents with a hard cast to her RLE d/t a sore on her foot, anterior.  She reports that she is not supposed to bear weight on it.  Pt however, stood up without any problem.

## 2016-05-06 NOTE — ED Notes (Signed)
While performing orthostatic v/s pt became dizzy and felt lightheaded no other complaint noted at this time also found glass in the bed and blood on her toe pt nor family doesn't know where the glass came from or where the blood came from on her toe

## 2016-05-06 NOTE — ED Notes (Signed)
Pt reports she got OOB this am, and fell while standing.  Pt reports waking up on the floor with her head on the plastic footstep beside her bed.  States when her family member moved the bed along with the footstep, she started to have pain R side of her neck.  She also reports bila LE numbness which is not new which is now all the way up to her thighs, bilaterally.  She did not start having numbness in her thighs until after the fall this am.  Pt is A&Ox 4.  No other obvious neuro deficits noted.

## 2016-05-06 NOTE — ED Notes (Signed)
PT have been made aware of urine sample 

## 2016-05-06 NOTE — ED Notes (Signed)
Family at bedside. 

## 2016-06-12 ENCOUNTER — Encounter (HOSPITAL_BASED_OUTPATIENT_CLINIC_OR_DEPARTMENT_OTHER): Payer: Self-pay | Admitting: *Deleted

## 2016-06-12 ENCOUNTER — Emergency Department (HOSPITAL_BASED_OUTPATIENT_CLINIC_OR_DEPARTMENT_OTHER)
Admission: EM | Admit: 2016-06-12 | Discharge: 2016-06-12 | Discharge status: Against Medical Advice | Payer: Self-pay | Attending: Emergency Medicine | Admitting: Emergency Medicine

## 2016-06-12 DIAGNOSIS — Z79899 Other long term (current) drug therapy: Secondary | ICD-10-CM | POA: Insufficient documentation

## 2016-06-12 DIAGNOSIS — M79674 Pain in right toe(s): Secondary | ICD-10-CM | POA: Insufficient documentation

## 2016-06-12 DIAGNOSIS — X58XXXA Exposure to other specified factors, initial encounter: Secondary | ICD-10-CM | POA: Insufficient documentation

## 2016-06-12 DIAGNOSIS — Y929 Unspecified place or not applicable: Secondary | ICD-10-CM | POA: Insufficient documentation

## 2016-06-12 DIAGNOSIS — Y939 Activity, unspecified: Secondary | ICD-10-CM | POA: Insufficient documentation

## 2016-06-12 DIAGNOSIS — Y999 Unspecified external cause status: Secondary | ICD-10-CM | POA: Insufficient documentation

## 2016-06-12 HISTORY — DX: Unspecified convulsions: R56.9

## 2016-06-12 HISTORY — DX: Post-traumatic stress disorder, unspecified: F43.10

## 2016-06-12 HISTORY — DX: Umbilical hernia without obstruction or gangrene: K42.9

## 2016-06-12 NOTE — ED Provider Notes (Signed)
[facility]   CSN: [id number] Arrival date & time: 06/12/16  4098     History   Chief Complaint Chief Complaint  Patient presents with  . Toe Injury    Infection    HPI [name] is a 36 y.o. female who presents to the ED with pain, redness and swelling to the right great toe. She has a hx of neuropathy and first noted the area a couple days ago as just redness. Today there is a blister area to the plantar aspect of the big toe and redness surrounding the nail.   HPI  Past Medical History:  Diagnosis Date  . Depression   . Diverticulitis   . Fibromyalgia   . Hernia, umbilical   . Nerve damage   . Neuropathy (HCC)   . PTSD (post-traumatic stress disorder)   . Seizures (HCC)    x 1     There are no active problems to display for this patient.   Past Surgical History:  Procedure Laterality Date  . APPENDECTOMY    . CESAREAN SECTION    . CHOLECYSTECTOMY    . NERVE BIOPSY Bilateral    lower leg  . SACRAL NERVE STIMULATOR PLACEMENT    . TONSILLECTOMY      OB History    No data available       Home Medications    Prior to Admission medications   Medication Sig Start Date End Date Taking? Authorizing Provider  amitriptyline (ELAVIL) 150 MG tablet Take 150 mg by mouth at bedtime.     Yes Historical Provider, MD  gabapentin (NEURONTIN) 300 MG capsule Take 600 mg by mouth 3 (three) times daily.    Yes Historical Provider, MD  lamoTRIgine (LAMICTAL) 200 MG tablet Take 200 mg by mouth 2 (two) times daily.    Yes Historical Provider, MD  prazosin (MINIPRESS) 1 MG capsule Take 1 mg by mouth at bedtime. 04/23/16  Yes Historical Provider, MD  TURMERIC PO Take 1 tablet by mouth daily.   Yes Historical Provider, MD  venlafaxine (EFFEXOR) 50 MG tablet Take 100 mg by mouth daily.   Yes Historical Provider, MD  HYDROcodone-acetaminophen (NORCO/VICODIN) 5-325 MG tablet Take 1-2 tablets every 6 hours as needed for severe pain Patient not taking:  Reported on 05/06/2016 10/25/15   [name], PA-C  ibuprofen (ADVIL,MOTRIN) 600 MG tablet Take 1 tablet (600 mg total) by mouth every 8 (eight) hours as needed. Patient not taking: Reported on 05/06/2016 09/16/14   Azalia Bilis, MD  magic mouthwash w/lidocaine SOLN Take 5 mLs by mouth 4 (four) times daily as needed for mouth pain (throat pain). Patient not taking: Reported on 05/06/2016 03/06/16   Gwyneth Sprout, MD  methocarbamol (ROBAXIN) 500 MG tablet Take 2 tablets (1,000 mg total) by mouth 4 (four) times daily. Patient not taking: Reported on 05/06/2016 10/25/15   [name], PA-C    Family History No family history on file.  Social History Social History  Substance Use Topics  . Smoking status: Never Smoker  . Smokeless tobacco: Never Used  . Alcohol use Yes     Comment: social     Allergies   Amoxicillin-pot clavulanate; Vigamox [moxifloxacin hydrochloride]; Latex; Dilaudid [hydromorphone hcl]; and Gluten meal   Review of Systems Review of Systems  Constitutional: Negative for chills and fever.  Gastrointestinal: Negative for nausea and vomiting.  Skin: Positive for wound.     Physical Exam Updated Vital Signs BP 127/84 (BP Location: Right Arm)  Pulse 98   Temp 98.9 F (37.2 C) (Oral)   Resp 16   Ht 5\' 3"  (1.6 m)   Wt 99.8 kg   LMP 05/15/2016   SpO2 97%   BMI 38.97 kg/m   Physical Exam  Constitutional: She appears well-developed and well-nourished.  HENT:  Head: Normocephalic.  Cardiovascular: Normal rate.   Neurological: She is alert.  Skin:  Right great toe with blister and erythema  Nursing note and vitals reviewed.    ED Treatments / Results  Labs (all labs ordered are listed, but only abnormal results are displayed) Labs Reviewed - No data to display   Radiology No results found.  Procedures Procedures (including critical care time) Order to soak right foot in warm water and antibacterial soap. 11:25 am Went back to check on  patient and re examine and patient was gone.   Medications Ordered in ED Medications - No data to display   Initial Impression / Assessment and Plan / ED Course  I have reviewed the triage vital signs and the nursing notes.  Final Clinical Impressions(s) / ED Diagnoses  Patient left prior to completion of exam and treatment.   New Prescriptions New Prescriptions   No medications on file     [facility] Health[facility] M Neese, NP 06/12/16 1134    Arby BarretteMarcy Pfeiffer, MD 06/24/16 [phone]

## 2016-06-12 NOTE — ED Triage Notes (Signed)
Patient states she noticed a sore on her left great toe four days ago. Red in color with bloody drainage, today noticed red streaks up her left foot.  History of peripheral nephropathy from unknown source.

## 2016-06-12 NOTE — ED Notes (Signed)
Patient left without telling anyone.  Attempted to call on mobile phone and got no answer.

## 2016-09-27 ENCOUNTER — Emergency Department (HOSPITAL_BASED_OUTPATIENT_CLINIC_OR_DEPARTMENT_OTHER)
Admission: EM | Admit: 2016-09-27 | Discharge: 2016-09-27 | Disposition: A | Discharge status: Home / Self Care | Payer: Self-pay | Attending: Emergency Medicine | Admitting: Emergency Medicine

## 2016-09-27 ENCOUNTER — Encounter (HOSPITAL_BASED_OUTPATIENT_CLINIC_OR_DEPARTMENT_OTHER): Payer: Self-pay | Admitting: Emergency Medicine

## 2016-09-27 DIAGNOSIS — J020 Streptococcal pharyngitis: Secondary | ICD-10-CM | POA: Insufficient documentation

## 2016-09-27 DIAGNOSIS — Z91040 Latex allergy status: Secondary | ICD-10-CM | POA: Insufficient documentation

## 2016-09-27 DIAGNOSIS — Z79899 Other long term (current) drug therapy: Secondary | ICD-10-CM | POA: Insufficient documentation

## 2016-09-27 MED ORDER — IBUPROFEN 400 MG PO TABS
600.0000 mg | ORAL_TABLET | Freq: Once | ORAL | Status: AC
  Administered 2016-09-27: 15:00:00 600 mg via ORAL
  Filled 2016-09-27: qty 1

## 2016-09-27 MED ORDER — DEXAMETHASONE 6 MG PO TABS
10.0000 mg | ORAL_TABLET | Freq: Once | ORAL | Status: AC
  Administered 2016-09-27: 10 mg via ORAL
  Filled 2016-09-27: qty 1

## 2016-09-27 MED ORDER — IBUPROFEN 600 MG PO TABS: 600.0000 mg | ORAL_TABLET | Freq: Three times a day (TID) | ORAL | 0 refills | Status: DC | PRN

## 2016-09-27 MED ORDER — CLINDAMYCIN HCL 300 MG PO CAPS: 300.0000 mg | ORAL_CAPSULE | Freq: Three times a day (TID) | ORAL | 0 refills | Status: AC

## 2016-09-27 MED FILL — CLINDAMYCIN HCL 300 MG CAPS: 300 | 10 days supply | Qty: 30 | Fill #0

## 2016-09-27 NOTE — ED Provider Notes (Signed)
[facility] Provider Note   CSN: [id number] Arrival date & time: 09/27/16  1454     History   Chief Complaint Chief Complaint  Patient presents with  . Sore Throat    HPI [name] is a 36 y.o. female.  HPI   36 year old female with history of recurrent strep pharyngitis here with sore throat for 3 weeks with now left neck swelling. The patient states her symptoms started as gradually progressive aching, stabbing sore throat that is worse with swallowing. Over the last week, she has noticed mild swelling in her left neck with associated mild pain. She states her sore throat has not significantly improved. She has had subjective fevers but no documented fevers. She denies any cough. Denies any nasal congestion. No recent sick contacts. No difficulty swallowing, only pain. No neck pain or stiffness.  Past Medical History:  Diagnosis Date  . Depression   . Diverticulitis   . Fibromyalgia   . Hernia, umbilical   . Nerve damage   . Neuropathy   . PTSD (post-traumatic stress disorder)   . Seizures (HCC)    x 1     There are no active problems to display for this patient.   Past Surgical History:  Procedure Laterality Date  . APPENDECTOMY    . CESAREAN SECTION    . CHOLECYSTECTOMY    . COLON SURGERY    . NERVE BIOPSY Bilateral    lower leg  . SACRAL NERVE STIMULATOR PLACEMENT    . TONSILLECTOMY      OB History    No data available       Home Medications    Prior to Admission medications   Medication Sig Start Date End Date Taking? Authorizing Provider  amitriptyline (ELAVIL) 150 MG tablet Take 150 mg by mouth at bedtime.     Yes [provider]  clonazePAM (KLONOPIN) 0.5 MG tablet Take 0.5 mg by mouth 2 (two) times daily as needed for anxiety.   Yes [provider]  gabapentin (NEURONTIN) 300 MG capsule Take 600 mg by mouth 3 (three) times daily.    Yes [provider]  lamoTRIgine (LAMICTAL) 200 MG tablet Take 200  mg by mouth 2 (two) times daily.    Yes [provider]  OLANZapine (ZYPREXA) 5 MG tablet Take 5 mg by mouth at bedtime.   Yes [provider]  prazosin (MINIPRESS) 1 MG capsule Take 1 mg by mouth at bedtime. 04/23/16  Yes [provider]  prochlorperazine (COMPAZINE) 5 MG tablet Take 5 mg by mouth every 6 (six) hours as needed for nausea or vomiting.   Yes [provider]  TURMERIC PO Take 1 tablet by mouth daily.   Yes [provider]  venlafaxine (EFFEXOR) 50 MG tablet Take 100 mg by mouth daily.   Yes [provider]  clindamycin (CLEOCIN) 300 MG capsule Take 1 capsule (300 mg total) by mouth 3 (three) times daily. 09/27/16 10/07/16  Shaune PollackIsaacs, Lakeyn Dokken, MD  ibuprofen (ADVIL,MOTRIN) 600 MG tablet Take 1 tablet (600 mg total) by mouth every 8 (eight) hours as needed for moderate pain. 09/27/16   Shaune PollackIsaacs, Etai Copado, MD    Family History No family history on file.  Social History Social History  Substance Use Topics  . Smoking status: Never Smoker  . Smokeless tobacco: Never Used  . Alcohol use Yes     Comment: social     Allergies   Amoxicillin-pot clavulanate; Vigamox [moxifloxacin hydrochloride]; Latex; Ativan [lorazepam]; Dilaudid [  hydromorphone hcl]; and Gluten meal   Review of Systems Review of Systems  Constitutional: Positive for fatigue and fever.  HENT: Positive for sore throat.   All other systems reviewed and are negative.    Physical Exam Updated Vital Signs BP (!) 141/87 (BP Location: Right Arm)   Pulse 82   Temp 99.1 F (37.3 C) (Oral)   Resp 16   Ht 5\' 3"  (1.6 m)   Wt 95.3 kg (210 lb)   LMP 09/14/2016   SpO2 97%   BMI 37.20 kg/m   Physical Exam  Constitutional: She is oriented to person, place, and time. She appears well-developed and well-nourished. No distress.  HENT:  Head: Normocephalic and atraumatic.  Moderate posterior pharyngeal erythema. There are bilateral exudates in the tonsillar beds. No  asymmetry of the peritonsillar spaces with no uvular deviation. Mild, tender, mobile left anterior cervical and submandibular lymphadenopathy. No neck stiffness or meningismus. No stridor.  Eyes: Conjunctivae are normal.  Neck: Neck supple.  Cardiovascular: Normal rate, regular rhythm and normal heart sounds.  Exam reveals no friction rub.   No murmur heard. Pulmonary/Chest: Effort normal and breath sounds normal. No respiratory distress. She has no wheezes. She has no rales.  Abdominal: She exhibits no distension.  Musculoskeletal: She exhibits no edema.  Neurological: She is alert and oriented to person, place, and time. She exhibits normal muscle tone.  Skin: Skin is warm. Capillary refill takes less than 2 seconds.  Psychiatric: She has a normal mood and affect.  Nursing note and vitals reviewed.    ED Treatments / Results  Labs (all labs ordered are listed, but only abnormal results are displayed) Labs Reviewed - No data to display  EKG  EKG Interpretation None       Radiology No results found.  Procedures Procedures (including critical care time)  Medications Ordered in ED Medications  dexamethasone (DECADRON) tablet 10 mg (not administered)  ibuprofen (ADVIL,MOTRIN) tablet 600 mg (not administered)     Initial Impression / Assessment and Plan / ED Course  I have reviewed the triage vital signs and the nursing notes.  Pertinent labs & imaging results that were available during my care of the patient were reviewed by me and considered in my medical decision making (see chart for details).    36 year old female with history of recurrent strep pharyngitis here with sore throat and mildly tender left cervical adenopathy. Exam is consistent with likely recurrent strep pharyngitis. Patient meets 4/4 [facility] criteria and will treat empirically. No evidence of significant peritonsillar abscess. No evidence of retropharyngeal abscess and she has fully mobile neck with no  difficulty swallowing. No stridor. She is vaccinated. Will give the patient a dose of Decadron for adenoid swelling, and discharged on clindamycin with ibuprofen as needed.  This note was prepared with assistance of Conservation officer, historic buildings. Occasional wrong-word or sound-a-like substitutions may have occurred due to the inherent limitations of voice recognition software.   Final Clinical Impressions(s) / ED Diagnoses   Final diagnoses:  Strep pharyngitis    New Prescriptions New Prescriptions   CLINDAMYCIN (CLEOCIN) 300 MG CAPSULE    Take 1 capsule (300 mg total) by mouth 3 (three) times daily.   IBUPROFEN (ADVIL,MOTRIN) 600 MG TABLET    Take 1 tablet (600 mg total) by mouth every 8 (eight) hours as needed for moderate pain.     Shaune Pollack, MD 09/27/16 [phone]

## 2016-09-27 NOTE — ED Triage Notes (Signed)
Sore throat x 3 weeks, pt states she feels a knot on the L side of her throat. Pain has worsened over the last 2 days.

## 2016-11-09 ENCOUNTER — Emergency Department (HOSPITAL_BASED_OUTPATIENT_CLINIC_OR_DEPARTMENT_OTHER)
Admission: EM | Admit: 2016-11-09 | Discharge: 2016-11-09 | Disposition: A | Discharge status: Home / Self Care | Payer: Medicaid Other | Attending: Emergency Medicine | Admitting: Emergency Medicine

## 2016-11-09 ENCOUNTER — Encounter (HOSPITAL_BASED_OUTPATIENT_CLINIC_OR_DEPARTMENT_OTHER): Payer: Self-pay | Admitting: Emergency Medicine

## 2016-11-09 ENCOUNTER — Emergency Department (HOSPITAL_BASED_OUTPATIENT_CLINIC_OR_DEPARTMENT_OTHER): Payer: Medicaid Other

## 2016-11-09 DIAGNOSIS — Z79899 Other long term (current) drug therapy: Secondary | ICD-10-CM | POA: Insufficient documentation

## 2016-11-09 DIAGNOSIS — Z91040 Latex allergy status: Secondary | ICD-10-CM | POA: Insufficient documentation

## 2016-11-09 DIAGNOSIS — L03115 Cellulitis of right lower limb: Secondary | ICD-10-CM | POA: Diagnosis not present

## 2016-11-09 DIAGNOSIS — M79671 Pain in right foot: Secondary | ICD-10-CM

## 2016-11-09 MED ORDER — IBUPROFEN 800 MG PO TABS
ORAL_TABLET | ORAL | Status: AC
  Administered 2016-11-09: 800 mg
  Filled 2016-11-09: qty 1

## 2016-11-09 MED ORDER — CEPHALEXIN 500 MG PO CAPS: 500.0000 mg | ORAL_CAPSULE | Freq: Four times a day (QID) | ORAL | 0 refills | Status: DC

## 2016-11-09 NOTE — ED Triage Notes (Signed)
Pt c/o RT foot pain and swelling x 2 days; sts she tried to open up the swelling on bottom of foot with a knife

## 2016-11-09 NOTE — Discharge Instructions (Signed)
Please take all of your antibiotics until finished!   You may develop abdominal discomfort or diarrhea from the antibiotic.  You may help offset this with probiotics which you can buy or get in yogurt. Do not eat or take the probiotics until 2 hours after your antibiotic.   Ibuprofen, naproxen, or Tylenol for pain. Keep the extremity elevated whenever possible. Be sure to stay well-hydrated. Follow up with your podiatrist, as scheduled next week. For worsening symptoms, [facility] .

## 2016-11-09 NOTE — ED Provider Notes (Signed)
[facility]   CSN: [id number] Arrival date & time: 11/09/16  1733  By signing my name below, I, Linna DarnerRussell Turner, attest that this documentation has been prepared under the direction and in the presence of Rudine Rieger, PA-C. Electronically Signed: Linna Darnerussell Turner, Scribe. 11/09/2016. 6:13 PM.  History   Chief Complaint Chief Complaint  Patient presents with  . Foot Pain   The history is provided by the patient. No language interpreter was used.    HPI Comments: [name] is a 36 y.o. female with PMHx including fibromyalgia and neuropathy who presents to the Emergency Department complaining of persistent distal right foot pain and swelling beginning a couple of days ago. 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 Patient sustained a wound to the plantar aspect of the right foot one year ago which was treated by her podiatrist. She is having pain and swelling around the old wound site as well as to the dorsal foot. The old wound site has been spontaneously draining clear, "sticky" fluid. Patient punctured her plantar right foot with a knife shortly after onset of her symptoms as she thought her swelling was due to a blister. She did not express any drainage. There are no alleviating factors noted. She reports that doctors have been unable to identify the cause of her neuropathy. She denies fevers or any other associated symptoms. Patient states she has taken cephalosporins, like Keflex, without difficulty despite her listed allergy of Augmentin. Podiatrist: Dr. Veleta MinersKerzner. Has an appointment next week.  Past Medical History:  Diagnosis Date  . Depression   . Diverticulitis   . Fibromyalgia   . Hernia, umbilical   . Nerve damage   . Neuropathy   . PTSD (post-traumatic stress disorder)   . Seizures (HCC)    x 1     There are no active  problems to display for this patient.   Past Surgical History:  Procedure Laterality Date  . APPENDECTOMY    . CESAREAN SECTION    . CHOLECYSTECTOMY    . COLON SURGERY    . NERVE BIOPSY Bilateral    lower leg  . SACRAL NERVE STIMULATOR PLACEMENT    . TONSILLECTOMY      OB History    No data available       Home Medications    Prior to Admission medications   Medication Sig Start Date End Date Taking? Authorizing Provider  QUEtiapine (SEROQUEL) 50 MG tablet Take 50 mg by mouth at bedtime.   Yes [provider]  amitriptyline (ELAVIL) 150 MG tablet Take 150 mg by mouth at bedtime.      [provider]  cephALEXin (KEFLEX) 500 MG capsule Take 1 capsule (500 mg total) by mouth 4 (four) times daily. 11/09/16   Emara Lichter C, PA-C  clonazePAM (KLONOPIN) 0.5 MG tablet Take 0.5 mg by mouth 2 (two) times daily as needed for anxiety.    [provider]  gabapentin (NEURONTIN) 300 MG capsule Take 600 mg by mouth 3 (three) times daily.     [provider]  ibuprofen (ADVIL,MOTRIN) 600 MG tablet Take 1 tablet (600 mg total) by mouth every 8 (eight) hours as needed for moderate pain. 09/27/16   Shaune PollackIsaacs, Cameron, MD  lamoTRIgine (LAMICTAL) 200 MG tablet Take 200 mg by mouth 2 (two) times daily.     [provider]  OLANZapine (ZYPREXA) 5 MG tablet Take 5 mg by mouth at bedtime.    [provider]  prazosin (MINIPRESS) 1 MG capsule Take 1 mg by mouth at bedtime. 04/23/16   [provider]  prochlorperazine (COMPAZINE) 5 MG tablet Take 5 mg by mouth every 6 (six) hours as needed for nausea or vomiting.    [provider]  TURMERIC PO Take 1 tablet by mouth daily.    [provider]  venlafaxine (EFFEXOR) 50 MG tablet Take 100 mg by mouth daily.    [provider]    Family History No family history on file.  Social History Social History  Substance Use Topics  . Smoking status: Never Smoker  . Smokeless  tobacco: Never Used  . Alcohol use Yes     Comment: social     Allergies   Amoxicillin-pot clavulanate; Vigamox [moxifloxacin hydrochloride]; Latex; Ativan [lorazepam]; Dilaudid [hydromorphone hcl]; and Gluten meal   Review of Systems Review of Systems  Constitutional: Negative for fever.  Musculoskeletal: Positive for arthralgias and joint swelling.  Skin: Positive for wound.  Neurological: Negative for numbness (none acute; baseline due to neuropathy).   Physical Exam Updated Vital Signs BP 118/81 (BP Location: Right Arm)   Pulse 90   Temp 98.6 F (37 C) (Oral)   Resp 18   Ht 5\' 3"  (1.6 m)   Wt 215 lb (97.5 kg)   LMP 10/26/2016   SpO2 97%   BMI 38.09 kg/m   Physical Exam  Constitutional: She appears well-developed and well-nourished. No distress.  HENT:  Head: Normocephalic and atraumatic.  Eyes: Conjunctivae are normal.  Neck: Neck supple.  Cardiovascular: Normal rate, regular rhythm and intact distal pulses.   Pulmonary/Chest: Effort normal.  Musculoskeletal: She exhibits edema. She exhibits no tenderness.  Full range of motion in the right foot and ankle. Patient is weightbearing.  Neurological: She is alert.  No noted acute sensory deficits. Strength in the right foot and ankle 5/5.  Skin: Skin is warm and dry. Capillary refill takes less than 2 seconds. She is not diaphoretic. There is erythema.  Erythema and associated edema to the dorsal and plantar distal surfaces of the right foot. Area of what appears to be keratotic prominence to the distal plantar surface. Clear, yellow-tinted exudate from the region. There is also an opening in the skin proximal to the area of prominence.  Psychiatric: She has a normal mood and affect. Her behavior is normal.  Nursing note and vitals reviewed.              ED Treatments / Results  Labs (all labs ordered are listed, but only abnormal results are displayed) Labs Reviewed - No data to display  EKG  EKG  Interpretation None       Radiology Dg Foot Complete Right  Result Date: 11/09/2016 CLINICAL DATA:  Distal foot pain for few days, no known injury, initial encounter EXAM: RIGHT FOOT COMPLETE - 3+ VIEW COMPARISON:  None. FINDINGS: Soft tissue wound is noted along the plantar aspect of the foot. No definitive bony erosion is seen. No acute fracture or dislocation. No definitive radiopaque foreign body is noted. IMPRESSION: Soft tissue wound without acute bony abnormality. Electronically Signed   By: Alcide CleverMark  Lukens M.D.   On: 11/09/2016 19:12    Procedures Procedures (including critical care time)  DIAGNOSTIC STUDIES: Oxygen Saturation is 97% on RA, normal by my interpretation.    COORDINATION OF CARE: 6:08 PM Discussed treatment plan with pt  at bedside and pt agreed to plan.  Medications Ordered in ED Medications  ibuprofen (ADVIL,MOTRIN) 800 MG tablet (800 mg  Given 11/09/16 1832)     Initial Impression / Assessment and Plan / ED Course  I have reviewed the triage vital signs and the nursing notes.  Pertinent labs & imaging results that were available during my care of the patient were reviewed by me and considered in my medical decision making (see chart for details).     Patient presents with apparent cellulitis of the foot. No signs of systemic illness. Patient has already scheduled follow-up with her podiatrist. The patient was given instructions for home care as well as return precautions. Patient voices understanding of these instructions, accepts the plan, and is comfortable with discharge.  Final Clinical Impressions(s) / ED Diagnoses   Final diagnoses:  Foot pain, right  Cellulitis of right lower extremity    New Prescriptions Discharge Medication List as of 11/09/2016  7:22 PM    START taking these medications   Details  cephALEXin (KEFLEX) 500 MG capsule Take 1 capsule (500 mg total) by mouth 4 (four) times daily., Starting Wed 11/09/2016, Print       I  personally performed the services described in this documentation, which was scribed in my presence. The recorded information has been reviewed and is accurate.   Anselm Pancoast, PA-C 11/11/16 0255    Tilden Fossa, MD 11/11/16 [phone]

## 2017-03-17 ENCOUNTER — Ambulatory Visit (HOSPITAL_COMMUNITY): Payer: Self-pay | Admitting: Psychiatry

## 2017-03-17 ENCOUNTER — Encounter (HOSPITAL_COMMUNITY): Payer: Self-pay

## 2017-04-19 ENCOUNTER — Ambulatory Visit (HOSPITAL_COMMUNITY): Payer: Self-pay | Admitting: Psychiatry

## 2017-08-12 IMAGING — CR DG FOOT COMPLETE 3+V*R*
4 series · 4 of 4 positions shown · non-contrast
Comparison: None.

CLINICAL DATA: Distal foot pain for few days, no known injury,
initial encounter

EXAM:
RIGHT FOOT COMPLETE - 3+ VIEW

[t foot ap right]
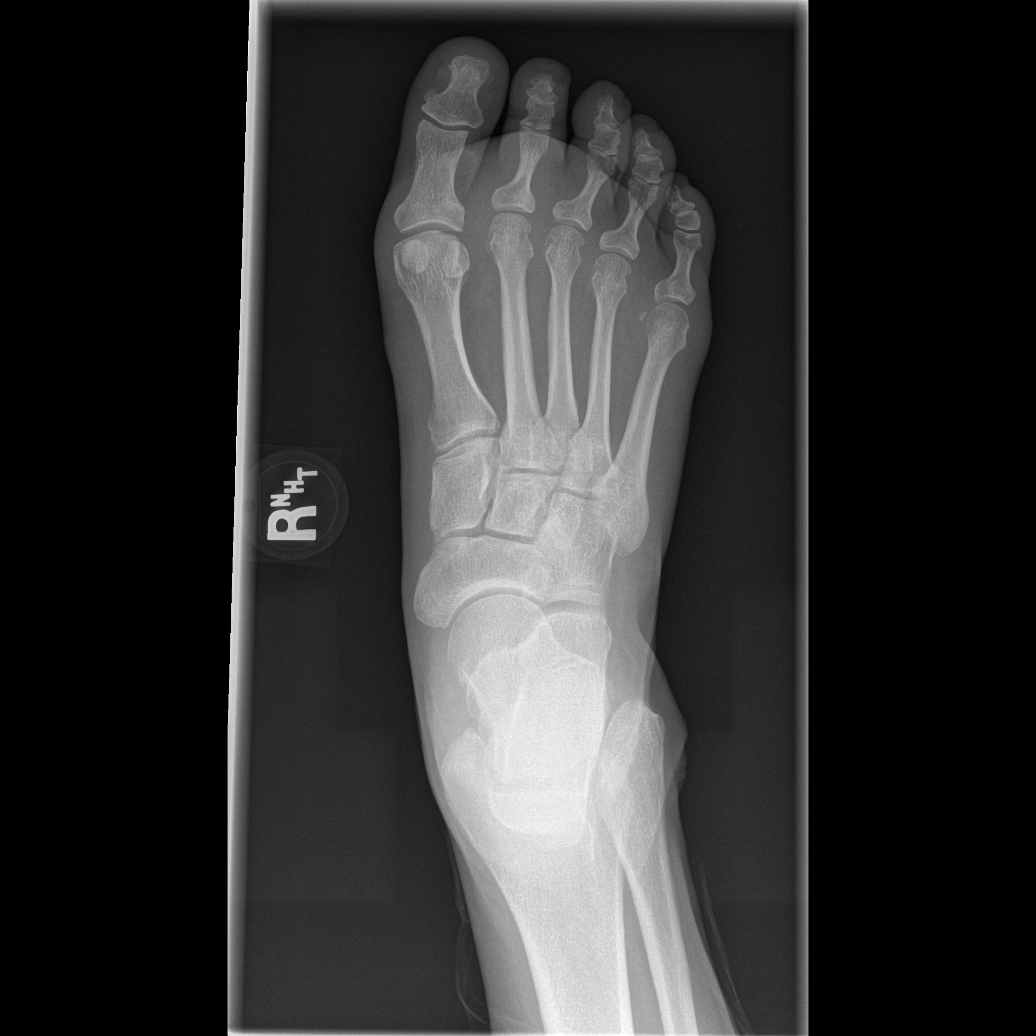

[t foot oblique right]
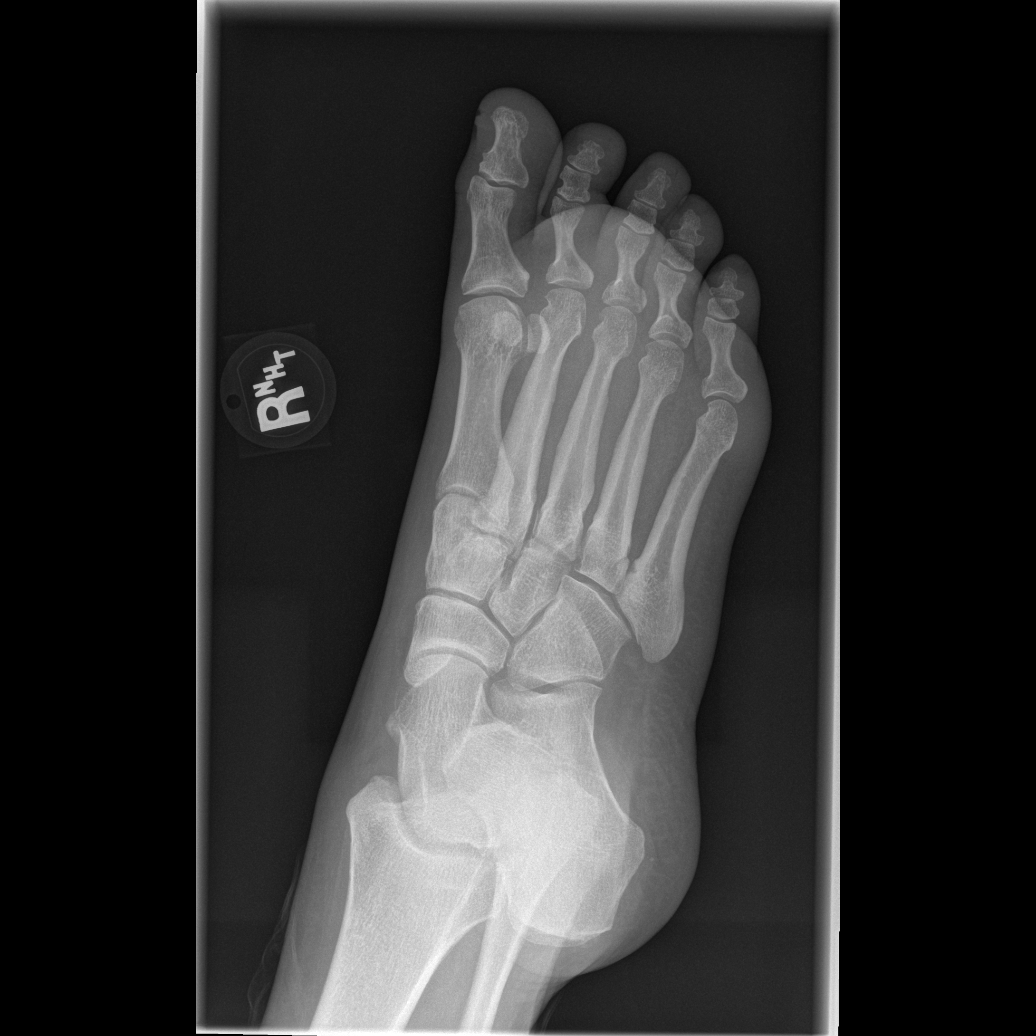

[t foot lat right (1 of 2)]
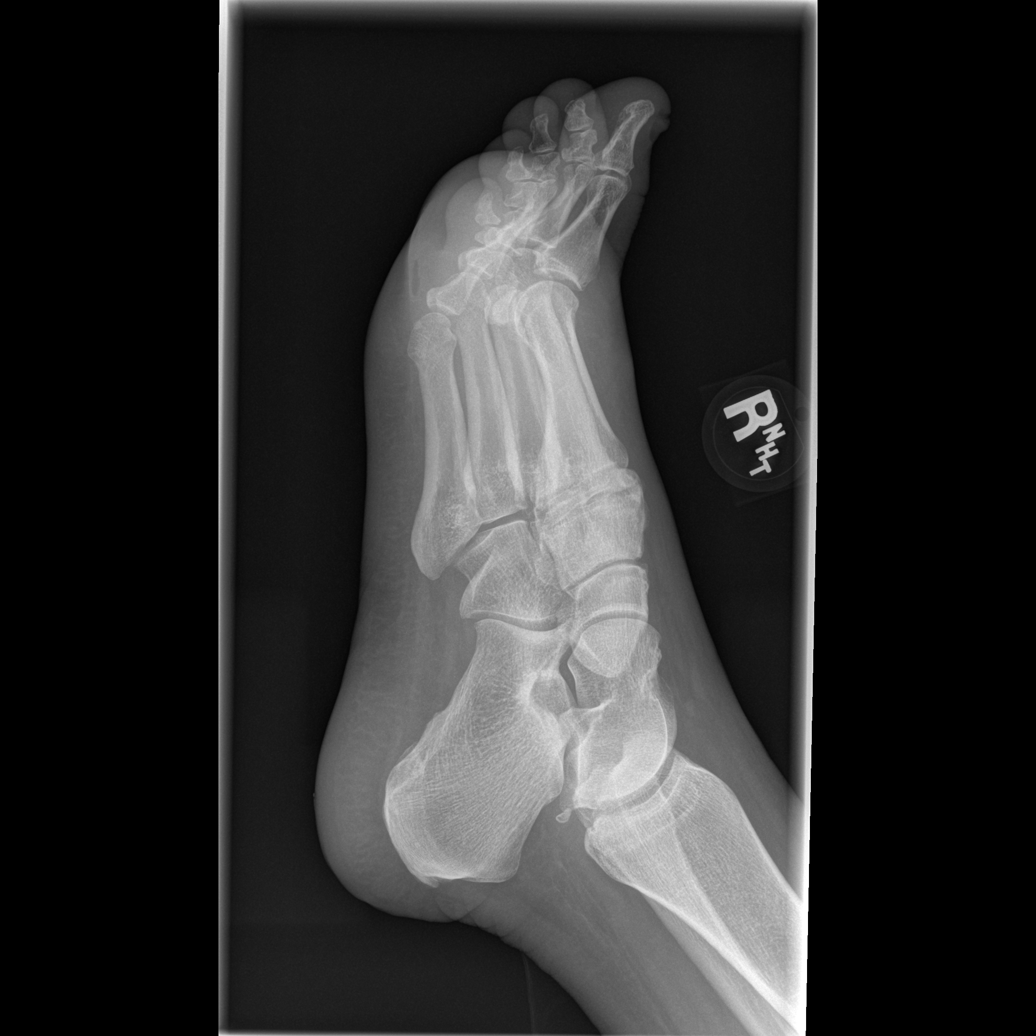

[t foot lat right (2 of 2)]
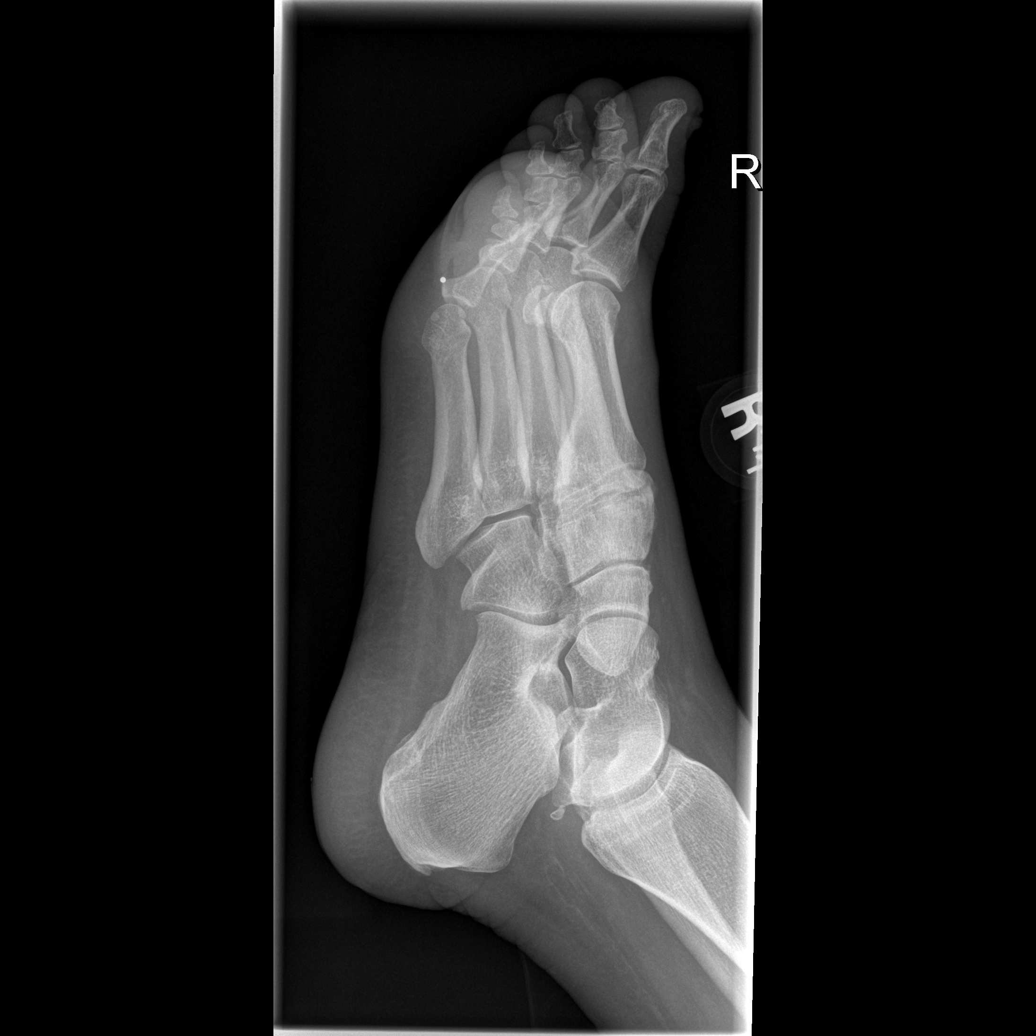

[4 of 4 positions shown; findings below may reference images not displayed]

FINDINGS: Soft tissue wound is noted along the plantar aspect of the foot. No
definitive bony erosion is seen. No acute fracture or dislocation.
No definitive radiopaque foreign body is noted.
IMPRESSION: Soft tissue wound without acute bony abnormality.

## 2018-09-13 ENCOUNTER — Other Ambulatory Visit: Payer: Self-pay

## 2018-09-13 ENCOUNTER — Emergency Department (HOSPITAL_BASED_OUTPATIENT_CLINIC_OR_DEPARTMENT_OTHER): Payer: Medicare Other

## 2018-09-13 ENCOUNTER — Encounter (HOSPITAL_BASED_OUTPATIENT_CLINIC_OR_DEPARTMENT_OTHER): Payer: Self-pay | Admitting: *Deleted

## 2018-09-13 ENCOUNTER — Emergency Department (HOSPITAL_BASED_OUTPATIENT_CLINIC_OR_DEPARTMENT_OTHER)
Admission: EM | Admit: 2018-09-13 | Discharge: 2018-09-13 | Disposition: A | Discharge status: Home / Self Care | Payer: Medicare Other | Attending: Emergency Medicine | Admitting: Emergency Medicine

## 2018-09-13 DIAGNOSIS — M25562 Pain in left knee: Secondary | ICD-10-CM | POA: Diagnosis present

## 2018-09-13 DIAGNOSIS — L03116 Cellulitis of left lower limb: Secondary | ICD-10-CM

## 2018-09-13 DIAGNOSIS — Z91040 Latex allergy status: Secondary | ICD-10-CM | POA: Diagnosis not present

## 2018-09-13 DIAGNOSIS — Z79899 Other long term (current) drug therapy: Secondary | ICD-10-CM | POA: Insufficient documentation

## 2018-09-13 MED ORDER — OXYCODONE-ACETAMINOPHEN 5-325 MG PO TABS
1.0000 | ORAL_TABLET | Freq: Once | ORAL | Status: AC
  Administered 2018-09-13: 12:00:00 1 via ORAL
  Filled 2018-09-13: qty 1

## 2018-09-13 MED ORDER — SULFAMETHOXAZOLE-TRIMETHOPRIM 800-160 MG PO TABS: 1.0000 | ORAL_TABLET | Freq: Two times a day (BID) | ORAL | 0 refills | Status: DC

## 2018-09-13 NOTE — ED Triage Notes (Signed)
Yesterday she slipped and fell on a stepping stone. She got sutures at an UC yesterday. She is having swelling, redness and pain since the fall.

## 2018-09-13 NOTE — ED Provider Notes (Signed)
[facility] EMERGENCY DEPARTMENT Provider Note   CSN: [id number] Arrival date & time: 09/13/18  1128    History   Chief Complaint Chief Complaint  Patient presents with  . Fall  . Knee Injury    HPI [name] is a 38 y.o. female.     The history is provided by the patient and medical records. No language interpreter was used.   [name] is a 38 y.o. female  with a PMH as listed below who presents to the Emergency Department complaining of persistent left knee pain and wound.  Patient states that she slipped and fell on cement yesterday.  She struck her left knee.  She was seen at the urgent care where her tetanus was updated.  Wound was sutured.  No x-ray was done.  She was started on Keflex for infection prevention.  She states that she took her first dose of that this morning.  When she awoke this morning, she noticed redness around her wound.  Her pain was also much worse and she felt as if her knee was a little swollen.  Pain is exacerbated with flexion and extension, however extension seems to be the most painful.  Does hurt with ambulation as well.  Cannot seem to find a comfortable position and pain is constant.  No fevers.  No weakness.  Has neuropathy at baseline, but no acute worsening or new symptoms in regards to numbness.  Past Medical History:  Diagnosis Date  . Depression   . Diverticulitis   . Fibromyalgia   . Hernia, umbilical   . Nerve damage   . Neuropathy   . PTSD (post-traumatic stress disorder)   . Seizures (HCC)    x 1     There are no active problems to display for this patient.   Past Surgical History:  Procedure Laterality Date  . APPENDECTOMY    . CESAREAN SECTION    . CHOLECYSTECTOMY    . COLON SURGERY    . FOOT TENDON SURGERY    . NERVE BIOPSY Bilateral    lower leg  . SACRAL NERVE STIMULATOR PLACEMENT    . TONSILLECTOMY       OB History   No obstetric history on file.      Home Medications    Prior to  Admission medications   Medication Sig Start Date End Date Taking? Authorizing Provider  amitriptyline (ELAVIL) 75 MG tablet Decrease to 75mg  daily.  Take 1 tablet daily 09/12/18  Yes [provider]  clonazePAM (KLONOPIN) 0.5 MG tablet Take by mouth. 05/09/16  Yes [provider]  Erenumab-aooe (AIMOVIG) 140 MG/ML SOAJ Inject into the skin. 07/02/18 07/03/19 Yes [provider]  lamoTRIgine (LAMICTAL) 200 MG tablet Take by mouth. 06/04/18 10/02/18 Yes [provider]  omeprazole (PRILOSEC) 40 MG capsule Take by mouth. 06/07/18  Yes [provider]  prazosin (MINIPRESS) 2 MG capsule Take by mouth. 06/16/18 06/16/19 Yes [provider]  pregabalin (LYRICA) 150 MG capsule Take by mouth. 03/22/18  Yes [provider]  QUEtiapine (SEROQUEL) 200 MG tablet Take by mouth. 08/23/18  Yes [provider]  rizatriptan (MAXALT) 10 MG tablet Take by mouth. 05/31/18 05/31/19 Yes [provider]  venlafaxine XR (EFFEXOR-XR) 75 MG 24 hr capsule Take by mouth. 07/17/18 01/13/19 Yes [provider]  zolpidem (AMBIEN CR) 12.5 MG CR tablet Take by mouth. 08/28/18 08/28/19 Yes [provider]  amitriptyline (ELAVIL) 150 MG tablet Take 150 mg by mouth  at bedtime.      [provider]  cephALEXin (KEFLEX) 500 MG capsule Take 1 capsule (500 mg total) by mouth 4 (four) times daily. 11/09/16   Joy, Shawn C, PA-C  clonazePAM (KLONOPIN) 0.5 MG tablet Take 0.5 mg by mouth 2 (two) times daily as needed for anxiety.    [provider]  gabapentin (NEURONTIN) 300 MG capsule Take 600 mg by mouth 3 (three) times daily.     [provider]  ibuprofen (ADVIL,MOTRIN) 600 MG tablet Take 1 tablet (600 mg total) by mouth every 8 (eight) hours as needed for moderate pain. 09/27/16   Shaune Pollack, MD  lamoTRIgine (LAMICTAL) 200 MG tablet Take 200 mg by mouth 2 (two) times daily.     [provider]  OLANZapine (ZYPREXA) 5  MG tablet Take 5 mg by mouth at bedtime.    [provider]  prazosin (MINIPRESS) 1 MG capsule Take 1 mg by mouth at bedtime. 04/23/16   [provider]  prochlorperazine (COMPAZINE) 5 MG tablet Take 5 mg by mouth every 6 (six) hours as needed for nausea or vomiting.    [provider]  QUEtiapine (SEROQUEL) 50 MG tablet Take 50 mg by mouth at bedtime.    [provider]  sulfamethoxazole-trimethoprim (BACTRIM DS) 800-160 MG tablet Take 1 tablet by mouth 2 (two) times daily for 7 days. 09/13/18 09/20/18  Blia Totman, Chase Picket, PA-C  TURMERIC PO Take 1 tablet by mouth daily.    [provider]  venlafaxine (EFFEXOR) 50 MG tablet Take 100 mg by mouth daily.    [provider]  FLUoxetine (PROZAC) 20 MG capsule Take 20 mg by mouth daily.  10/25/15  [provider]  promethazine (PHENERGAN) 25 MG tablet Take 1 tablet (25 mg total) by mouth every 6 (six) hours as needed for nausea or vomiting. 09/25/14 10/25/15  Vanetta Mulders, MD  topiramate (TOPAMAX) 25 MG capsule Take 25 mg by mouth 4 (four) times daily.  10/25/15  [provider]    Family History No family history on file.  Social History Social History   Tobacco Use  . Smoking status: Never Smoker  . Smokeless tobacco: Never Used  Substance Use Topics  . Alcohol use: Not Currently    Comment: social  . Drug use: No     Allergies   Amoxicillin-pot clavulanate; Vigamox [moxifloxacin hydrochloride]; Latex; Ativan [lorazepam]; Dilaudid [hydromorphone hcl]; and Gluten meal   Review of Systems Review of Systems  Constitutional: Negative for fever.  Respiratory: Negative for shortness of breath.   Cardiovascular: Negative for chest pain.  Musculoskeletal: Positive for arthralgias, joint swelling and myalgias.  Skin: Positive for color change and wound.  Neurological: Negative for weakness and numbness.     Physical Exam Updated Vital Signs BP 130/83 (BP Location: Right  Arm)   Pulse (!) 103   Temp 98.7 F (37.1 C) (Oral)   Resp 18   Ht  (1.676 m)   Wt 108.9 kg   LMP 08/17/2018   SpO2 99%   BMI 38.74 kg/m   Physical Exam Vitals signs and nursing note reviewed.  Constitutional:      General: She is not in acute distress.    Appearance: She is well-developed.  HENT:     Head: Normocephalic and atraumatic.  Neck:     Musculoskeletal: Neck supple.  Cardiovascular:     Rate and Rhythm: Normal rate and regular rhythm.     Heart sounds: Normal heart sounds.  No murmur.  Pulmonary:     Effort: Pulmonary effort is normal. No respiratory distress.     Breath sounds: Normal breath sounds.  Abdominal:     General: There is no distension.     Palpations: Abdomen is soft.     Tenderness: There is no abdominal tenderness.  Musculoskeletal:     Comments: Diffuse tenderness to the anterior left knee.  Decreased range of motion likely secondary to pain.  Negative drawer's.  No varus/valgus laxity.  2+ DP.  Sensation intact.  All compartments are soft.  Wound with sutures in place which does have surrounding erythema.  No drainage from the wound.  Skin:    General: Skin is warm and dry.     Comments: See image below of the left knee.   Neurological:     Mental Status: She is alert and oriented to person, place, and time.        ED Treatments / Results  Labs (all labs ordered are listed, but only abnormal results are displayed) Labs Reviewed - No data to display  EKG None  Radiology Dg Knee Complete 4 Views Left  Result Date: 09/13/2018 CLINICAL DATA:  Left knee laceration after fall yesterday. EXAM: LEFT KNEE - COMPLETE 4+ VIEW COMPARISON:  Radiographs of March 26, 2011. FINDINGS: No evidence of fracture, dislocation, or joint effusion. No evidence of arthropathy or other focal bone abnormality. Soft tissues are unremarkable. IMPRESSION: Negative. Electronically Signed   By: Lupita Raider M.D.   On: 09/13/2018 12:09    Procedures  Procedures (including critical care time)  Medications Ordered in ED Medications  oxyCODONE-acetaminophen (PERCOCET/ROXICET) 5-325 MG per tablet 1 tablet (1 tablet Oral Given 09/13/18 1159)     Initial Impression / Assessment and Plan / ED Course  I have reviewed the triage vital signs and the nursing notes.  Pertinent labs & imaging results that were available during my care of the patient were reviewed by me and considered in my medical decision making (see chart for details).       [name] is a 38 y.o. female who presents to ED for left knee pain and wound.  She slipped and fell yesterday and was evaluated at urgent care.  Her tetanus was updated, wound sutured and she was started on Keflex for infection prevention.  She reports taking her first dose of Keflex this morning.  She awoke this morning with worsening pain and erythema around the wound.  On exam today, she is afebrile, hemodynamically stable and neurovascularly intact to the left lower extremity.  All compartments are soft.  Does have diffuse tenderness to the anterior left knee.  No x-ray done at urgent care, therefore we will go ahead and get imaging of the knee today to assess for underlying injuries from the fall. She does seem to have erythema around the wound concerning for early infection.   X-ray independently reviewed by me. No acute findings. No foreign bodies in wound. No bony abnormalities.   Rx for #20 Tramadol filled yesterday per [place] Controlled Substance Database. Encouraged to take this as needed for pain and no further narcotic indicated.   Will add Bactrim to antibiotic regimen.  Erythema demarcated with skin marker and dated.  Recommended follow-up in 24 to 36 hours for recheck.  Discussed that she should return to ER sooner should she develop fever or area of redness significantly worsen such as streaking up the leg.   In regards to her knee pain from  the fall, encouraged her to follow-up with her PCP  if symptoms do not improve with symptomatic management over the next week.  All questions answered.  Patient discussed with Dr. Donnald Garre who agrees with treatment plan.    Final Clinical Impressions(s) / ED Diagnoses   Final diagnoses:  Acute pain of left knee  Cellulitis of left lower extremity    ED Discharge Orders         Ordered    sulfamethoxazole-trimethoprim (BACTRIM DS) 800-160 MG tablet  2 times daily     09/13/18 1309           Larya Charpentier, Chase Picket, PA-C 09/13/18 1313    Arby Barrette, MD 09/28/18 1432

## 2018-09-13 NOTE — Discharge Instructions (Signed)
It was my pleasure taking care of you today!   Continue taking your Keflex that you were prescribed yesterday as directed.  Start taking the Bactrim antibiotic as well.  This means you will be taking 2 antibiotics.   I would like you to have your knee rechecked in 24 to 36 hours.  You can come back here, see an urgent care or see your regular doctor.  I would like to ensure the area of redness that is likely infection is not getting any worse.  Please return sooner if you develop a fever, area of redness is getting much worse, or if you have any additional concerns.  If the pain in your knee from the fall is not getting better after a week, please see your primary care doctor.

## 2018-09-14 ENCOUNTER — Encounter (HOSPITAL_BASED_OUTPATIENT_CLINIC_OR_DEPARTMENT_OTHER): Payer: Self-pay | Admitting: Emergency Medicine

## 2018-09-14 ENCOUNTER — Emergency Department (HOSPITAL_BASED_OUTPATIENT_CLINIC_OR_DEPARTMENT_OTHER)
Admission: EM | Admit: 2018-09-14 | Discharge: 2018-09-14 | Disposition: A | Discharge status: Home / Self Care | Payer: Medicare Other | Attending: Emergency Medicine | Admitting: Emergency Medicine

## 2018-09-14 ENCOUNTER — Other Ambulatory Visit: Payer: Self-pay

## 2018-09-14 DIAGNOSIS — L03116 Cellulitis of left lower limb: Secondary | ICD-10-CM | POA: Diagnosis not present

## 2018-09-14 DIAGNOSIS — Z4800 Encounter for change or removal of nonsurgical wound dressing: Secondary | ICD-10-CM | POA: Diagnosis present

## 2018-09-14 DIAGNOSIS — Z91040 Latex allergy status: Secondary | ICD-10-CM | POA: Insufficient documentation

## 2018-09-14 DIAGNOSIS — Z79899 Other long term (current) drug therapy: Secondary | ICD-10-CM | POA: Diagnosis not present

## 2018-09-14 DIAGNOSIS — L0390 Cellulitis, unspecified: Secondary | ICD-10-CM

## 2018-09-14 MED ORDER — CLINDAMYCIN HCL 150 MG PO CAPS: 300.0000 mg | ORAL_CAPSULE | Freq: Four times a day (QID) | ORAL | 0 refills | Status: AC

## 2018-09-14 MED ORDER — ONDANSETRON HCL 4 MG PO TABS: 4.0000 mg | ORAL_TABLET | Freq: Four times a day (QID) | ORAL | 0 refills | Status: AC

## 2018-09-14 MED ORDER — SODIUM CHLORIDE 0.9 % IV BOLUS
1000.0000 mL | Freq: Once | INTRAVENOUS | Status: AC
  Administered 2018-09-14: 17:00:00 1000 mL via INTRAVENOUS

## 2018-09-14 MED ORDER — ONDANSETRON HCL 4 MG/2ML IJ SOLN
4.0000 mg | Freq: Once | INTRAMUSCULAR | Status: AC
  Administered 2018-09-14: 18:00:00 4 mg via INTRAVENOUS
  Filled 2018-09-14: qty 2

## 2018-09-14 MED ORDER — CLINDAMYCIN PHOSPHATE 900 MG/50ML IV SOLN
900.0000 mg | Freq: Once | INTRAVENOUS | Status: AC
  Administered 2018-09-14: 17:00:00 900 mg via INTRAVENOUS
  Filled 2018-09-14: qty 50

## 2018-09-14 NOTE — ED Notes (Signed)
Incision site is red with intact sutures.

## 2018-09-14 NOTE — ED Notes (Signed)
ED Provider at bedside. 

## 2018-09-14 NOTE — ED Triage Notes (Signed)
Pt seen here yesterday for pain and redness in wound on left knee.  Lines of demarcation were drawn yesterday and the redness has extended beyond those lines.  Pt was told to return if that happened.

## 2018-09-14 NOTE — ED Provider Notes (Signed)
[facility] EMERGENCY DEPARTMENT Provider Note   CSN: [id number] Arrival date & time: 09/14/18  1630    History   Chief Complaint Chief Complaint  Patient presents with  . Wound Check    HPI [name] is a 38 y.o. female.     The history is provided by the patient.  Wound Check  This is a new problem. The current episode started 2 days ago. The problem occurs constantly. The problem has been gradually worsening. Pertinent negatives include no chest pain, no abdominal pain, no headaches and no shortness of breath. Associated symptoms comments: Left knee pain, redness. The symptoms are aggravated by walking. Nothing relieves the symptoms. She has tried nothing for the symptoms. The treatment provided no relief.    Past Medical History:  Diagnosis Date  . Depression   . Diverticulitis   . Fibromyalgia   . Hernia, umbilical   . Nerve damage   . Neuropathy   . PTSD (post-traumatic stress disorder)   . Seizures (HCC)    x 1     There are no active problems to display for this patient.   Past Surgical History:  Procedure Laterality Date  . APPENDECTOMY    . CESAREAN SECTION    . CHOLECYSTECTOMY    . COLON SURGERY    . FOOT TENDON SURGERY    . NERVE BIOPSY Bilateral    lower leg  . SACRAL NERVE STIMULATOR PLACEMENT    . TONSILLECTOMY       OB History   No obstetric history on file.      Home Medications    Prior to Admission medications   Medication Sig Start Date End Date Taking? Authorizing Provider  amitriptyline (ELAVIL) 75 MG tablet Decrease to 75mg  daily.  Take 1 tablet daily 09/12/18   [provider]  clindamycin (CLEOCIN) 150 MG capsule Take 2 capsules (300 mg total) by mouth 4 (four) times daily for 10 days. 09/14/18 09/24/18  Jeffrey Voth, DO  clonazePAM (KLONOPIN) 0.5 MG tablet Take 0.5 mg by mouth 2 (two) times daily as needed for anxiety.    [provider]  Erenumab-aooe (AIMOVIG) 140 MG/ML SOAJ Inject into the  skin. 07/02/18 07/03/19  [provider]  lamoTRIgine (LAMICTAL) 200 MG tablet Take 200 mg by mouth 2 (two) times daily.     [provider]  omeprazole (PRILOSEC) 40 MG capsule Take by mouth. 06/07/18   [provider]  prazosin (MINIPRESS) 2 MG capsule Take by mouth. 06/16/18 06/16/19  [provider]  pregabalin (LYRICA) 150 MG capsule Take by mouth. 03/22/18   [provider]  QUEtiapine (SEROQUEL) 200 MG tablet Take by mouth. 08/23/18   [provider]  rizatriptan (MAXALT) 10 MG tablet Take by mouth. 05/31/18 05/31/19  [provider]  venlafaxine XR (EFFEXOR-XR) 75 MG 24 hr capsule Take by mouth. 07/17/18 01/13/19  [provider]  zolpidem (AMBIEN CR) 12.5 MG CR tablet Take by mouth. 08/28/18 08/28/19  [provider]    Family History No family history on file.  Social History Social History   Tobacco Use  . Smoking status: Never Smoker  . Smokeless tobacco: Never Used  Substance Use Topics  . Alcohol use: Not Currently    Comment: social  . Drug use: No     Allergies   Amoxicillin-pot clavulanate; Vigamox [moxifloxacin hydrochloride]; Latex; Ativan [lorazepam]; Dilaudid [hydromorphone hcl]; and Gluten meal   Review of Systems Review of Systems  Constitutional: Negative for  chills and fever.  HENT: Negative for ear pain and sore throat.   Eyes: Negative for pain and visual disturbance.  Respiratory: Negative for cough and shortness of breath.   Cardiovascular: Negative for chest pain and palpitations.  Gastrointestinal: Negative for abdominal pain and vomiting.  Genitourinary: Negative for dysuria and hematuria.  Musculoskeletal: Negative for arthralgias, back pain and joint swelling.  Skin: Positive for color change and wound (sutures in place). Negative for rash.  Neurological: Negative for seizures, syncope and headaches.  All other systems reviewed and are negative.    Physical Exam Updated  Vital Signs  ED Triage Vitals  Enc Vitals Group     BP 09/14/18 1635 125/80     Pulse Rate 09/14/18 1635 (!) 102     Resp 09/14/18 1635 16     Temp 09/14/18 1635 99.8 F (37.7 C)     Temp Source 09/14/18 1635 Oral     SpO2 09/14/18 1635 100 %     Weight 09/14/18 1636 240 lb (108.9 kg)     Height 09/14/18 1636 5\' 6"  (1.676 m)     Head Circumference --      Peak Flow --      Pain Score 09/14/18 1637 8     Pain Loc --      Pain Edu? --      Excl. in GC? --     Physical Exam Constitutional:      General: She is not in acute distress.    Appearance: She is not ill-appearing.  Cardiovascular:     Pulses: Normal pulses.     Heart sounds: Normal heart sounds.  Musculoskeletal: Normal range of motion.        General: Swelling (mild swelling around left knee) and tenderness present.  Skin:    General: Skin is warm.     Findings: Erythema present.  Neurological:     General: No focal deficit present.     Mental Status: She is alert and oriented to person, place, and time.     Sensory: No sensory deficit.     Motor: No weakness.      ED Treatments / Results  Labs (all labs ordered are listed, but only abnormal results are displayed) Labs Reviewed - No data to display  EKG None  Radiology Dg Knee Complete 4 Views Left  Result Date: 09/13/2018 CLINICAL DATA:  Left knee laceration after fall yesterday. EXAM: LEFT KNEE - COMPLETE 4+ VIEW COMPARISON:  Radiographs of March 26, 2011. FINDINGS: No evidence of fracture, dislocation, or joint effusion. No evidence of arthropathy or other focal bone abnormality. Soft tissues are unremarkable. IMPRESSION: Negative. Electronically Signed   By: Lupita RaiderJames  Green Jr M.D.   On: 09/13/2018 12:09    Procedures Procedures (including critical care time)  Medications Ordered in ED Medications  ondansetron (ZOFRAN) injection 4 mg (has no administration in time range)  clindamycin (CLEOCIN) IVPB 900 mg (900 mg Intravenous New Bag/Given  09/14/18 1725)  sodium chloride 0.9 % bolus 1,000 mL (1,000 mLs Intravenous New Bag/Given 09/14/18 1719)     Initial Impression / Assessment and Plan / ED Course  I have reviewed the triage vital signs and the nursing notes.  Pertinent labs & imaging results that were available during my care of the patient were reviewed by me and considered in my medical decision making (see chart for details).     Coralie Commonshlie M Doren is a 38 year old female with history of depression, diverticulitis, seizures who presents to  the ED for wound recheck.  Patient suffered a laceration to her left knee 2 days ago and was started on Keflex.  She had some redness and came back for evaluation to the emergency department yesterday and was also started on Bactrim.  The redness was outlined and she was told to return to the ED in 24 hours for recheck.  Patient has slightly increased redness around prior outline.  However overall patient has been on antibiotics for about less than 48 hours.  She has been on Bactrim for 2 doses.  She has overall decent range of motion of her knee and no concern for septic joint.  She does not appear to have any crepitus and there is no concern for necrotizing process at this time.  Overall I do not believe patient has had adequate time for antibiotics to take effect.  Patient is on 2 antibiotics at this time and I would like to simplify her regimen to just 1 antibiotic with clindamycin.  Patient is okay with this plan as she states that Bactrim is only $0.50.  I am hesitant to say that patient has failed outpatient therapy at this time as redness and swelling are fairly localized at this time.    After long discussion with the patient she prefers to switch antibiotics over to clindamycin.  She does not want to be admitted to the hospitalist for IV antibiotics.  She seems very reliable to adequately follow-up.  Patient is willing to stay for her first dose of IV clindamycin in the emergency department.   We will give her Zofran as sometimes she has nausea with this medicine.  We outlined the wound and told patient to return to the ED if after 48 hours wound does not appear to be improving.  Told her to return to the ED sooner if redness rapidly expands and she is okay with this plan.  Patient was discharged from ED in good condition.  Patient had overall unremarkable vitals.  No concern for sepsis.  This chart was dictated using voice recognition software.  Despite best efforts to proofread,  errors can occur which can change the documentation meaning.    Final Clinical Impressions(s) / ED Diagnoses   Final diagnoses:  Cellulitis, unspecified cellulitis site    ED Discharge Orders         Ordered    clindamycin (CLEOCIN) 150 MG capsule  4 times daily     09/14/18 1801           Virgina Norfolk, DO 09/14/18 1802

## 2018-10-08 ENCOUNTER — Other Ambulatory Visit: Payer: Self-pay

## 2018-10-08 ENCOUNTER — Encounter (HOSPITAL_BASED_OUTPATIENT_CLINIC_OR_DEPARTMENT_OTHER): Payer: Self-pay

## 2018-10-08 ENCOUNTER — Emergency Department (HOSPITAL_BASED_OUTPATIENT_CLINIC_OR_DEPARTMENT_OTHER)
Admission: EM | Admit: 2018-10-08 | Discharge: 2018-10-08 | Disposition: A | Discharge status: Home / Self Care | Payer: Medicare Other | Attending: Emergency Medicine | Admitting: Emergency Medicine

## 2018-10-08 DIAGNOSIS — M25562 Pain in left knee: Secondary | ICD-10-CM | POA: Insufficient documentation

## 2018-10-08 DIAGNOSIS — Z5321 Procedure and treatment not carried out due to patient leaving prior to being seen by health care provider: Secondary | ICD-10-CM | POA: Insufficient documentation

## 2018-10-08 NOTE — ED Notes (Signed)
Called for Triage, Treatment, or Rooming: x 3

## 2018-10-08 NOTE — Progress Notes (Addendum)
[name] ED TOC CM -referral 3 ED visits in 6 months.   Arranged follow up appt for pt to see PCP on 10/12/2018 at 1215 pm. Contacted pt and left message on voice mail for return call. Jonnie Finner RN CCM Case Mgmt phone [phone]  10/09/2018 2:55 pm. Spoke to pt and states she does plan to keep appointment. Jonnie Finner RN CCM Case Mgmt phone [phone]

## 2018-10-08 NOTE — ED Triage Notes (Addendum)
C/o pain to left knee since injury 5/29-pt reports she had sutures placed/removed and abx-pt tested +covid with no sx on 6/9-NAD-to triage in w/c

## 2020-04-07 ENCOUNTER — Other Ambulatory Visit: Payer: Self-pay

## 2020-04-07 ENCOUNTER — Encounter: Payer: Self-pay | Admitting: Emergency Medicine

## 2020-04-07 ENCOUNTER — Emergency Department (INDEPENDENT_AMBULATORY_CARE_PROVIDER_SITE_OTHER)
Admission: EM | Admit: 2020-04-07 | Discharge: 2020-04-07 | Disposition: A | Discharge status: Home / Self Care | Payer: Medicare Other | Source: Home / Self Care | Attending: Family Medicine | Admitting: Family Medicine

## 2020-04-07 DIAGNOSIS — L720 Epidermal cyst: Secondary | ICD-10-CM | POA: Diagnosis not present

## 2020-04-07 NOTE — ED Provider Notes (Signed)
[name]    CSN: [id number] Arrival date & time: 04/07/20  1013      History   Chief Complaint Chief Complaint  Patient presents with  . Mass    Left leg    HPI [name] is a 39 y.o. female.   Patient has small lump on right lower leg.  There is no erythema drainage or increased temp.  She thinks this may be an abscess and need I&D.  HPI  Past Medical History:  Diagnosis Date  . Depression   . Diverticulitis   . Fibromyalgia   . Hernia, umbilical   . Nerve damage   . Neuropathy   . PTSD (post-traumatic stress disorder)   . Seizures (HCC)    x 1     There are no problems to display for this patient.   Past Surgical History:  Procedure Laterality Date  . APPENDECTOMY    . CESAREAN SECTION    . CHOLECYSTECTOMY    . COLON SURGERY    . FOOT TENDON SURGERY    . NERVE BIOPSY Bilateral    lower leg  . SACRAL NERVE STIMULATOR PLACEMENT    . TONSILLECTOMY      OB History   No obstetric history on file.      Home Medications    Prior to Admission medications   Medication Sig Start Date End Date Taking? Authorizing Provider  amitriptyline (ELAVIL) 75 MG tablet Decrease to 75mg  daily.  Take 1 tablet daily 09/12/18  Yes [provider]  baclofen (LIORESAL) 10 MG tablet Take 1 tablet by mouth 3 (three) times daily. 03/16/20  Yes [provider]  lamoTRIgine (LAMICTAL) 200 MG tablet Take 200 mg by mouth 2 (two) times daily.    Yes [provider]  omeprazole (PRILOSEC) 40 MG capsule Take by mouth. 06/07/18  Yes [provider]  QUEtiapine (SEROQUEL) 200 MG tablet Take by mouth. 08/23/18  Yes [provider]  Rimegepant Sulfate (NURTEC) 75 MG TBDP Take by mouth. 02/18/20  Yes [provider]  clonazePAM (KLONOPIN) 0.5 MG tablet Take 0.5 mg by mouth 2 (two) times daily as needed for anxiety. Patient not taking: Reported on 04/07/2020    [provider]  prazosin (MINIPRESS) 2 MG capsule  Take by mouth. 06/16/18 06/16/19  [provider]  pregabalin (LYRICA) 150 MG capsule Take by mouth. Patient not taking: Reported on 04/07/2020 03/22/18   [provider]  rizatriptan (MAXALT) 10 MG tablet Take by mouth. 05/31/18 05/31/19  [provider]  venlafaxine XR (EFFEXOR-XR) 75 MG 24 hr capsule Take by mouth. 07/17/18 01/13/19  [provider]  zolpidem (AMBIEN CR) 12.5 MG CR tablet Take 6.5 mg by mouth. 08/28/18 08/28/19  [provider]    Family History Family History  Problem Relation Age of Onset  . Healthy Mother   . Healthy Father   . Healthy Sister   . Healthy Sister     Social History Social History   Tobacco Use  . Smoking status: Never Smoker  . Smokeless tobacco: Never Used  Vaping Use  . Vaping Use: Never used  Substance Use Topics  . Alcohol use: Not Currently    Comment: social  . Drug use: No     Allergies   Amoxicillin-pot clavulanate, Vigamox [moxifloxacin hydrochloride], Cephalexin, Clindamycin, Latex, Ativan [lorazepam], Dilaudid [hydromorphone hcl], and Gluten meal   Review of Systems Review of Systems  Skin: Positive for wound.  Right lower leg  All other systems reviewed and are negative.    Physical Exam Triage Vital Signs ED Triage Vitals  Enc Vitals Group     BP 04/07/20 1024 115/81     Pulse Rate 04/07/20 1024 90     Resp 04/07/20 1024 16     Temp 04/07/20 1024 98.7 F (37.1 C)     Temp Source 04/07/20 1024 Oral     SpO2 04/07/20 1024 98 %     Weight 04/07/20 1027 201 lb (91.2 kg)     Height 04/07/20 1027 5\' 3"  (1.6 m)     Head Circumference --      Peak Flow --      Pain Score 04/07/20 1026 4     Pain Loc --      Pain Edu? --      Excl. in GC? --    No data found.  Updated Vital Signs BP 115/81 (BP Location: Right Arm)   Pulse 90   Temp 98.7 F (37.1 C) (Oral)   Resp 16   Ht 5\' 3"  (1.6 m)   Wt 91.2 kg   LMP 09/14/2018   SpO2 98%   BMI 35.61 kg/m   Visual  Acuity Right Eye Distance:   Left Eye Distance:   Bilateral Distance:    Right Eye Near:   Left Eye Near:    Bilateral Near:     Physical Exam Vitals and nursing note reviewed.  Constitutional:      Appearance: Normal appearance.  Cardiovascular:     Rate and Rhythm: Normal rate.  Pulmonary:     Effort: Pulmonary effort is normal.  Skin:    Comments: There is a tiny lump on the right lower leg.  There is no erythema or fever.  This is mobile.  It is not large enough to transilluminate.  It is superficial making me think it is probably an epidermal cyst.  Neurological:     Mental Status: She is alert.      UC Treatments / Results  Labs (all labs ordered are listed, but only abnormal results are displayed) Labs Reviewed - No data to display  EKG   Radiology No results found.  Procedures Procedures (including critical care time)  Medications Ordered in UC Medications - No data to display  Initial Impression / Assessment and Plan / UC Course  I have reviewed the triage vital signs and the nursing notes.  Pertinent labs & imaging results that were available during my care of the patient were reviewed by me and considered in my medical decision making (see chart for details).     Epidermal cyst of the lower leg Final Clinical Impressions(s) / UC Diagnoses   Final diagnoses:  Epidermal cyst   Discharge Instructions   None    ED Prescriptions    None     PDMP not reviewed this encounter.   , MD 04/07/20 1047

## 2020-04-07 NOTE — ED Triage Notes (Signed)
Pt states she noticed small lump on leg on Sat - no redness or drainage noted  Asked RN if it could be "cut open" Pt denies any fever or chills No OTC meds for moderate pain  No COVID vaccine or Flu vaccine

## 2020-10-10 ENCOUNTER — Emergency Department (HOSPITAL_BASED_OUTPATIENT_CLINIC_OR_DEPARTMENT_OTHER)
Admission: EM | Admit: 2020-10-10 | Discharge: 2020-10-10 | Disposition: A | Discharge status: Home / Self Care | Payer: Medicare Other | Attending: Emergency Medicine | Admitting: Emergency Medicine

## 2020-10-10 ENCOUNTER — Other Ambulatory Visit: Payer: Self-pay

## 2020-10-10 ENCOUNTER — Encounter (HOSPITAL_BASED_OUTPATIENT_CLINIC_OR_DEPARTMENT_OTHER): Payer: Self-pay | Admitting: *Deleted

## 2020-10-10 DIAGNOSIS — Z5321 Procedure and treatment not carried out due to patient leaving prior to being seen by health care provider: Secondary | ICD-10-CM | POA: Insufficient documentation

## 2020-10-10 DIAGNOSIS — G43909 Migraine, unspecified, not intractable, without status migrainosus: Secondary | ICD-10-CM | POA: Insufficient documentation

## 2020-10-10 NOTE — ED Triage Notes (Signed)
She c/o typical migraine--not respinding to home meds. She is awake and alert and in no distress.

## 2022-12-07 ENCOUNTER — Encounter: Payer: Self-pay | Admitting: Psychiatry

## 2022-12-07 ENCOUNTER — Ambulatory Visit: Payer: Medicare Other | Admitting: Psychiatry

## 2022-12-07 VITALS — BP 133/85 | HR 75 | Ht 65.0 in | Wt 198.2 lb

## 2022-12-07 DIAGNOSIS — G43019 Migraine without aura, intractable, without status migrainosus: Secondary | ICD-10-CM | POA: Diagnosis not present

## 2022-12-07 MED ORDER — NURTEC 75 MG PO TBDP: 75.0000 mg | ORAL_TABLET | ORAL | 6 refills | Status: DC

## 2022-12-07 NOTE — Progress Notes (Signed)
Referring:  Monia Pouch., MD [address] [place],  [place] [address]  PCP: Corrington, Meredith Mody, MD  Neurology was asked to evaluate [name], a 42 year old female for a chief complaint of headaches.  Our recommendations of care will be communicated by shared medical record.    CC:  headaches  History provided from self  HPI:  Medical co-morbidities: HLD, GERD, anemia, osteomyelitis, PTSD, panic disorder, ADHD, fibromyalgia, ?seizure in 2013 (convulsive episode with postictal confusion, EEG normal), idiopathic sensorimotor polyneuropathy, s/p spinal cord stimulator  The patient presents for evaluation of headaches which began when she was 42 years old. They have been worsening over time. Migraines are associated with photophobia, phonophobia, and nausea. They can last up to 5 days at a time. She is averaging 2 migraines per week. She has had 7 migraines in the past month.  She is currently only taking Excedrin as needed. This is not very effective. Has tried multiple preventive medications including Botox in the past without success. Took Nurtec as needed previously which she did find helpful.  Headache History: Onset: age 35 Aura: no Associated Symptoms:  Photophobia: yes  Phonophobia: yes  Nausea: yes Vomiting: yes Worse with activity?: Duration of headaches: 5 days  Migraine days per month: 7 Headache free days per month: 13  Current Treatment: Abortive Excedrin  Preventative none  Prior Therapies                                 Rescue: Tylenol Excedrin Ibuprofen toradol Oxycodone Imitrex Maxalt Nurtec - helped Robaxin Flexeril Baclofen Seroquel Compazine Phenergan Reglan Zofran hydroxyzine  Prevention: Prazosin Lamictal Topamax Gabapentin carbamazepine Lyrica Prozac Zoloft Effexor Amitriptyline Emgality Aimovig Botox - lack of efficacy   LABS: CBC    Component Value Date/Time   WBC 9.5 05/06/2016 1410   RBC  4.65 05/06/2016 1410   HGB 13.0 05/06/2016 1410   HCT 37.2 05/06/2016 1410   PLT 251 05/06/2016 1410   MCV 80.0 05/06/2016 1410   MCH 28.0 05/06/2016 1410   MCHC 34.9 05/06/2016 1410   RDW 13.3 05/06/2016 1410   LYMPHSABS 2.3 09/25/2014 1240   MONOABS 0.6 09/25/2014 1240   EOSABS 0.1 09/25/2014 1240   BASOSABS 0.0 09/25/2014 1240      Latest Ref Rng & Units 05/06/2016    2:10 PM 05/18/2015   12:20 PM 09/25/2014   12:40 PM  CMP  Glucose 65 - 99 mg/dL 84  409  95   BUN 6 - 20 mg/dL 7  7  9    Creatinine 0.44 - 1.00 mg/dL 8.11  9.14  7.82   Sodium 135 - 145 mmol/L 141  142  140   Potassium 3.5 - 5.1 mmol/L 3.4  3.9  4.0   Chloride 101 - 111 mmol/L 109  108  109   CO2 22 - 32 mmol/L 25  26  23    Calcium 8.9 - 10.3 mg/dL 8.8  8.6  8.9   Total Protein 6.5 - 8.1 g/dL   7.3   Total Bilirubin 0.3 - 1.2 mg/dL   0.3   Alkaline Phos 38 - 126 U/L   79   AST 15 - 41 U/L   25   ALT 14 - 54 U/L   25      IMAGING:  MRI brain 2013: unremarkable  Imaging independently reviewed on December 07, 2022   Current Outpatient Medications  on File Prior to Visit  Medication Sig Dispense Refill   ARIPiprazole (ABILIFY) 10 MG tablet Take 10 mg by mouth daily.     doxepin (SINEQUAN) 25 MG capsule Take 25 mg by mouth at bedtime.     GuanFACINE HCl (INTUNIV) 3 MG TB24 Take 3 mg by mouth 2 (two) times daily.     HUMIRA, 2 PEN, 40 MG/0.4ML pen Inject 40 mg into the skin. Every 2 weeks.     lamoTRIgine (LAMICTAL) 200 MG tablet Take 200 mg by mouth 2 (two) times daily.      methotrexate (RHEUMATREX) 2.5 MG tablet Take 2.5 mg by mouth once a week. Every Friday night     omeprazole (PRILOSEC) 40 MG capsule Take by mouth.     amitriptyline (ELAVIL) 75 MG tablet Decrease to 75mg  daily.  Take 1 tablet daily (Patient not taking: Reported on 12/07/2022)     baclofen (LIORESAL) 10 MG tablet Take 1 tablet by mouth 3 (three) times daily. (Patient not taking: Reported on 12/07/2022)     clonazePAM (KLONOPIN) 0.5 MG tablet  Take 0.5 mg by mouth 2 (two) times daily as needed for anxiety. (Patient not taking: Reported on 04/07/2020)     prazosin (MINIPRESS) 2 MG capsule Take by mouth.     pregabalin (LYRICA) 150 MG capsule Take by mouth. (Patient not taking: Reported on 04/07/2020)     QUEtiapine (SEROQUEL) 200 MG tablet Take by mouth. (Patient not taking: Reported on 12/07/2022)     venlafaxine XR (EFFEXOR-XR) 75 MG 24 hr capsule Take 225 mg by mouth. (Patient not taking: Reported on 12/07/2022)     zolpidem (AMBIEN CR) 12.5 MG CR tablet Take 6.5 mg by mouth.     No current facility-administered medications on file prior to visit.     Allergies: Allergies  Allergen Reactions   Amoxicillin-Pot Clavulanate Anaphylaxis and Hives   Vigamox [Moxifloxacin Hydrochloride] Swelling    Eyes swell   Cephalexin Nausea And Vomiting    Other reaction(s): Abdominal Pain, Vomiting   Clindamycin Nausea And Vomiting   Latex Hives   Ativan [Lorazepam]    Dilaudid [Hydromorphone Hcl] Hives   Gluten Meal Nausea And Vomiting    Per patient    Family History: Family History  Problem Relation Age of Onset   Healthy Mother    Healthy Father    Healthy Sister    Healthy Sister      Past Medical History: Past Medical History:  Diagnosis Date   Depression    Diverticulitis    Fibromyalgia    Hernia, umbilical    Nerve damage    Neuropathy    PTSD (post-traumatic stress disorder)    RA (rheumatoid arthritis) (HCC)    Seizures (HCC)    x 1     Past Surgical History Past Surgical History:  Procedure Laterality Date   APPENDECTOMY     CESAREAN SECTION     CHOLECYSTECTOMY     COLON SURGERY     FOOT TENDON SURGERY     NERVE BIOPSY Bilateral    lower leg   SACRAL NERVE STIMULATOR PLACEMENT     TONSILLECTOMY      Social History: Social History   Tobacco Use   Smoking status: Never   Smokeless tobacco: Never  Vaping Use   Vaping status: Never Used  Substance Use Topics   Alcohol use: Not Currently     Comment: social   Drug use: Yes    Types: Marijuana    Comment:  daily at bedtime    ROS: Negative for fevers, chills. Positive for headaches. All other systems reviewed and negative unless stated otherwise in HPI.   Physical Exam:   Vital Signs: BP 133/85 (BP Location: Left Arm, Patient Position: Sitting, Cuff Size: Normal)   Pulse 75   Ht 5\' 5"  (1.651 m)   Wt 198 lb 3.2 oz (89.9 kg)   LMP 09/14/2018   BMI 32.98 kg/m  GENERAL: well appearing,in no acute distress,alert SKIN:  Color, texture, turgor normal. No rashes or lesions HEAD:  Normocephalic/atraumatic. CV:  RRR RESP: Normal respiratory effort MSK: no tenderness to palpation over occiput, neck, or shoulders  NEUROLOGICAL: Mental Status: Alert, oriented to person, place and time,Follows commands Cranial Nerves: PERRL, visual fields intact to confrontation, extraocular movements intact, facial sensation intact, no facial droop or ptosis, hearing grossly intact, no dysarthria Motor: muscle strength 5/5 both upper and lower extremities Reflexes: 2+ throughout Sensation: intact to light touch bilateral upper extremities, decreased sensation to light touch bilateral lower extremities from feet up to thigh Coordination: Finger-to- nose-finger intact bilaterally Gait: normal-based   IMPRESSION: 42 year old female with a history of HLD, GERD, anemia, osteomyelitis, PTSD, panic disorder, ADHD, fibromyalgia, ?seizure in 2013 (convulsive episode with postictal confusion, EEG normal), idiopathic sensorimotor polyneuropathy, s/p spinal cord stimulator who presents for evaluation of migraines. She has failed multiple preventive medications including Botox. Did have some success with Nurtec for rescue. Will start Nurtec every other day for migraine prevention.  PLAN: -Start Nurtec 75 mg every other day for migraine rescue/prevention -Next steps: consider Qulipta, Ajovy, or Vyepti for prevention   I spent a total of 22 minutes chart  reviewing and counseling the patient. Headache education was done. Discussed treatment options including preventive and acute medications. Discussed medication side effects, adverse reactions and drug interactions. Written educational materials and patient instructions outlining all of the above were given.  Follow-up: 5 months   Ocie Doyne, MD 12/07/2022   9:37 AM

## 2022-12-23 ENCOUNTER — Telehealth: Payer: Self-pay

## 2022-12-23 ENCOUNTER — Other Ambulatory Visit: Payer: Self-pay | Admitting: Medical Genetics

## 2022-12-23 ENCOUNTER — Other Ambulatory Visit (HOSPITAL_COMMUNITY): Payer: Self-pay

## 2022-12-23 DIAGNOSIS — Z006 Encounter for examination for normal comparison and control in clinical research program: Secondary | ICD-10-CM

## 2022-12-23 NOTE — Telephone Encounter (Signed)
*  [facility]  Pharmacy Patient Advocate Encounter  Received notification from [employer] that Prior Authorization for Nurtec 75MG  dispersible tablets  has been APPROVED from 12/23/2022 to 04/18/2023. Ran test claim, Copay is $refill too soon. This test claim was processed through [facility]- copay amounts may vary at other pharmacies due to pharmacy/plan contracts, or as the patient moves through the different stages of their insurance plan.   PA #/Case ID/Reference #: [id number]

## 2022-12-29 ENCOUNTER — Ambulatory Visit: Payer: Medicare Other | Admitting: Psychiatry

## 2023-04-27 ENCOUNTER — Other Ambulatory Visit (HOSPITAL_COMMUNITY): Payer: Self-pay

## 2023-05-08 ENCOUNTER — Telehealth: Payer: Self-pay | Admitting: Psychiatry

## 2023-05-08 NOTE — Telephone Encounter (Signed)
Pt called to request being put on wait list

## 2023-05-16 ENCOUNTER — Encounter: Payer: Self-pay | Admitting: Neurology

## 2023-05-16 NOTE — Telephone Encounter (Signed)
From hospital note:

## 2023-06-13 ENCOUNTER — Telehealth: Payer: Medicare Other | Admitting: Neurology

## 2023-06-13 ENCOUNTER — Telehealth: Payer: Self-pay | Admitting: Neurology

## 2023-06-13 NOTE — Telephone Encounter (Signed)
 [facility] cancellation

## 2023-07-18 ENCOUNTER — Other Ambulatory Visit: Payer: Self-pay

## 2023-07-18 ENCOUNTER — Emergency Department (HOSPITAL_BASED_OUTPATIENT_CLINIC_OR_DEPARTMENT_OTHER)
Admission: EM | Admit: 2023-07-18 | Discharge: 2023-07-18 | Discharge status: Against Medical Advice | Attending: Emergency Medicine | Admitting: Emergency Medicine

## 2023-07-18 ENCOUNTER — Encounter (HOSPITAL_BASED_OUTPATIENT_CLINIC_OR_DEPARTMENT_OTHER): Payer: Self-pay | Admitting: Emergency Medicine

## 2023-07-18 DIAGNOSIS — R1110 Vomiting, unspecified: Secondary | ICD-10-CM | POA: Diagnosis present

## 2023-07-18 DIAGNOSIS — Z5321 Procedure and treatment not carried out due to patient leaving prior to being seen by health care provider: Secondary | ICD-10-CM | POA: Insufficient documentation

## 2023-07-18 LAB — COMPREHENSIVE METABOLIC PANEL WITH GFR
ALT: 15 U/L (ref 0–44)
AST: 17 U/L (ref 15–41)
Albumin: 4.8 g/dL (ref 3.5–5.0)
Alkaline Phosphatase: 68 U/L (ref 38–126)
Anion gap: 10 (ref 5–15)
BUN: 6 mg/dL (ref 6–20)
CO2: 27 mmol/L (ref 22–32)
Calcium: 9.3 mg/dL (ref 8.9–10.3)
Chloride: 108 mmol/L (ref 98–111)
Creatinine, Ser: 0.89 mg/dL (ref 0.44–1.00)
GFR, Estimated: >60 mL/min (ref >60)
Glucose, Bld: 132 mg/dL — ABNORMAL HIGH (ref 70–99)
Potassium: 3.2 mmol/L — ABNORMAL LOW (ref 3.5–5.1)
Sodium: 145 mmol/L (ref 135–145)
Total Bilirubin: 0.4 mg/dL (ref 0.0–1.2)
Total Protein: 7.4 g/dL (ref 6.5–8.1)

## 2023-07-18 LAB — URINALYSIS, ROUTINE W REFLEX MICROSCOPIC
Bilirubin Urine: NEGATIVE
Glucose, UA: NEGATIVE mg/dL
Hgb urine dipstick: NEGATIVE
Ketones, ur: NEGATIVE mg/dL
Leukocytes,Ua: NEGATIVE
Nitrite: NEGATIVE
Protein, ur: NEGATIVE mg/dL
Specific Gravity, Urine: 1.02 (ref 1.005–1.030)
pH: 7 (ref 5.0–8.0)

## 2023-07-18 LAB — CBC
HCT: 44 % (ref 36.0–46.0)
Hemoglobin: 15.4 g/dL — ABNORMAL HIGH (ref 12.0–15.0)
MCH: 30.1 pg (ref 26.0–34.0)
MCHC: 35 g/dL (ref 30.0–36.0)
MCV: 86.1 fL (ref 80.0–100.0)
Platelets: 359 10*3/uL (ref 150–400)
RBC: 5.11 MIL/uL (ref 3.87–5.11)
RDW: 12.2 % (ref 11.5–15.5)
WBC: 9.8 10*3/uL (ref 4.0–10.5)
nRBC: 0 % (ref 0.0–0.2)

## 2023-07-18 LAB — LIPASE, BLOOD: Lipase: 46 U/L (ref 11–51)

## 2023-07-18 NOTE — ED Triage Notes (Signed)
 Vomiting since last Monday Today worse unable to keep food down

## 2023-07-18 NOTE — ED Notes (Signed)
 Patient requested IV out  States that she is leaving because other people got to come back before her. Explained the triage process and advised that she should wait to see and provider and offered for Provider to see her next. Patient declined and stated that she would go home and drink Pedialyte.

## 2023-12-13 ENCOUNTER — Ambulatory Visit: Admitting: Physician Assistant

## 2023-12-13 ENCOUNTER — Encounter: Payer: Self-pay | Admitting: Physician Assistant

## 2023-12-13 VITALS — BP 123/83 | HR 60

## 2023-12-13 DIAGNOSIS — D2122 Benign neoplasm of connective and other soft tissue of left lower limb, including hip: Secondary | ICD-10-CM

## 2023-12-13 DIAGNOSIS — Z840 Family history of diseases of the skin and subcutaneous tissue: Secondary | ICD-10-CM | POA: Diagnosis not present

## 2023-12-13 DIAGNOSIS — D219 Benign neoplasm of connective and other soft tissue, unspecified: Secondary | ICD-10-CM

## 2023-12-13 NOTE — Patient Instructions (Signed)

## 2023-12-13 NOTE — Progress Notes (Signed)
   New Patient Visit   Subjective  [name] is a 43 y.o. 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 Pt states it will get bigger and almost come to a head. Pt was told it may come back. Pt admits to a history of skin cancer (grand father)    accompanied by mother  The following portions of the chart were reviewed this encounter and updated as appropriate: medications, allergies, medical history  Review of Systems:  No other skin or systemic complaints except as noted in HPI or Assessment and Plan.  Objective  Well appearing patient in no apparent distress; mood and affect are within normal limits.   A focused examination was performed of the following areas:  Left lower leg        Relevant exam findings are noted in the Assessment and Plan.       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 Surgical excision is best treatment. Offered surgery here but I explained we are booking out into November and patient prefers surgery prior to then. Plan to refer to [facility].       Return for reffered to plastic surgery.    Documentation: I have reviewed the above documentation for accuracy and completeness, and I agree with the above.  Virgil Slinger K, PA-C      ANGIOLEIOMYOMA   Related Procedures Ambulatory referral to Plastic Surgery  Return for reffered to plastic surgery.  I, Doyce Pan, CMA, am acting as scribe for Charla Criscione K, PA-C.   Documentation: I have reviewed the above documentation for accuracy and completeness, and I agree with the above.  Kenyata Napier K,  PA-C

## 2023-12-20 ENCOUNTER — Ambulatory Visit: Admitting: Plastic Surgery

## 2023-12-20 ENCOUNTER — Encounter: Payer: Self-pay | Admitting: Plastic Surgery

## 2023-12-20 VITALS — BP 118/78 | HR 81 | Ht 65.0 in | Wt 188.2 lb

## 2023-12-20 DIAGNOSIS — D2122 Benign neoplasm of connective and other soft tissue of left lower limb, including hip: Secondary | ICD-10-CM | POA: Diagnosis not present

## 2023-12-20 DIAGNOSIS — D219 Benign neoplasm of connective and other soft tissue, unspecified: Secondary | ICD-10-CM

## 2023-12-21 ENCOUNTER — Encounter: Payer: Self-pay | Admitting: Plastic Surgery

## 2023-12-21 NOTE — Progress Notes (Signed)
 Referring Provider Corrington, Coye LABOR, MD [phone]  [place] [address]   CC:  Chief Complaint  Patient presents with   Consult      [name] is an 43 y.o. female.  HPI: [name] is a 43 year old female who is referred from her dermatologist for management of a hemangioma lipoma.  Patient states that she has had a biopsy well over a year ago for a lesion that was quite painful on the inner portion of left leg.  She was seen by her dermatologist but they were not able to get her in as soon as she would like to have the lesion excised.  A pathology report from August 2024 states that a biopsy was performed and the pathology was an angioleiomyoma.  Allergies  Allergen Reactions   Amoxicillin-Pot Clavulanate Anaphylaxis and Hives   Vigamox [Moxifloxacin Hydrochloride] Swelling    Eyes swell   Cephalexin  Nausea And Vomiting    Other reaction(s): Abdominal Pain, Vomiting   Clindamycin  Nausea And Vomiting   Latex Hives   Ativan [Lorazepam]    Dilaudid [Hydromorphone Hcl] Hives    Outpatient Encounter Medications as of 12/20/2023  Medication Sig Note   amLODipine (NORVASC) 10 MG tablet Take 10 mg by mouth daily.    ARIPiprazole (ABILIFY) 10 MG tablet Take 10 mg by mouth daily.    brexpiprazole (REXULTI) 1 MG TABS tablet Take by mouth daily.    buPROPion (WELLBUTRIN SR) 150 MG 12 hr tablet Take 150 mg by mouth 2 (two) times daily.    busPIRone (BUSPAR) 15 MG tablet Take 15 mg by mouth 2 (two) times daily.    butalbital-acetaminophen -caffeine (FIORICET) 50-325-40 MG tablet Take 1-2 tablets by mouth every 6 (six) hours as needed.    doxycycline  (MONODOX ) 100 MG capsule Take 100 mg by mouth 2 (two) times daily.    lamoTRIgine (LAMICTAL) 200 MG tablet Take 200 mg by mouth 2 (two) times daily.     losartan (COZAAR) 50 MG tablet Take 50 mg by mouth daily.    metoprolol tartrate (LOPRESSOR) 25 MG tablet Take 25 mg by mouth 2 (two) times daily.    nitrofurantoin,  macrocrystal-monohydrate, (MACROBID) 100 MG capsule Take 100 mg by mouth 2 (two) times daily.    omeprazole (PRILOSEC) 40 MG capsule Take by mouth.    ondansetron  (ZOFRAN -ODT) 4 MG disintegrating tablet Take 4 mg by mouth every 8 (eight) hours as needed.    phenazopyridine (PYRIDIUM) 200 MG tablet Take 200 mg by mouth 3 (three) times daily as needed.    rizatriptan (MAXALT) 10 MG tablet Take one tablet (10 mg dose) by mouth once as needed for Migraine for up to 1 dose. May repeat in 2 hours if needed    SSD 1 % cream SMARTSIG:Topical Daily    temazepam (RESTORIL) 30 MG capsule Take 30 mg by mouth at bedtime as needed for sleep.    [DISCONTINUED] amitriptyline (ELAVIL) 75 MG tablet Decrease to 75mg  daily.  Take 1 tablet daily (Patient not taking: Reported on 12/07/2022)    [DISCONTINUED] baclofen (LIORESAL) 10 MG tablet Take 1 tablet by mouth 3 (three) times daily. (Patient not taking: Reported on 12/07/2022)    [DISCONTINUED] clonazePAM (KLONOPIN) 0.5 MG tablet Take 0.5 mg by mouth 2 (two) times daily as needed for anxiety. (Patient not taking: Reported on 04/07/2020)    [DISCONTINUED] doxepin (SINEQUAN) 25 MG capsule Take 25 mg by mouth at bedtime.    [DISCONTINUED] GuanFACINE HCl (INTUNIV)  3 MG TB24 Take 3 mg by mouth 2 (two) times daily.    [DISCONTINUED] HUMIRA, 2 PEN, 40 MG/0.4ML pen Inject 40 mg into the skin. Every 2 weeks.    [DISCONTINUED] methotrexate (RHEUMATREX) 2.5 MG tablet Take 2.5 mg by mouth once a week. Every Friday night    [DISCONTINUED] prazosin (MINIPRESS) 2 MG capsule Take by mouth.    [DISCONTINUED] pregabalin (LYRICA) 150 MG capsule Take by mouth. (Patient not taking: Reported on 04/07/2020)    [DISCONTINUED] QUEtiapine (SEROQUEL) 200 MG tablet Take by mouth. (Patient not taking: Reported on 12/07/2022)    [DISCONTINUED] Rimegepant Sulfate (NURTEC) 75 MG TBDP Take 1 tablet (75 mg total) by mouth every other day.    [DISCONTINUED] topiramate (TOPAMAX) 50 MG tablet Take 50 mg by  mouth daily.    [DISCONTINUED] venlafaxine XR (EFFEXOR-XR) 75 MG 24 hr capsule Take 225 mg by mouth. (Patient not taking: Reported on 12/07/2022)    [DISCONTINUED] zolpidem (AMBIEN CR) 12.5 MG CR tablet Take 6.5 mg by mouth. 04/07/2020: Pt is taking per pt   No facility-administered encounter medications on file as of 12/20/2023.     Past Medical History:  Diagnosis Date   Depression    Diverticulitis    Fibromyalgia    Hernia, umbilical    Nerve damage    Neuropathy    PTSD (post-traumatic stress disorder)    RA (rheumatoid arthritis) (HCC)    Seizures (HCC)    x 1     Past Surgical History:  Procedure Laterality Date   APPENDECTOMY     CESAREAN SECTION     CHOLECYSTECTOMY     COLON SURGERY     FOOT TENDON SURGERY     NERVE BIOPSY Bilateral    lower leg   SACRAL NERVE STIMULATOR PLACEMENT     TONSILLECTOMY      Family History  Problem Relation Age of Onset   Healthy Mother    Healthy Father    Healthy Sister    Healthy Sister     Social History   Social History Narrative   Right handed    Drinks coffee daily 1-2 cups   Wear glasses and contacts      Review of Systems General: Denies fevers, chills, weight loss CV: Denies chest pain, shortness of breath, palpitations Skin: Painful lesion on the inner portion of the left leg  Physical Exam    12/20/2023    3:05 PM 12/13/2023   10:46 AM 07/18/2023    1:09 PM  Vitals with BMI  Height 5' 5    Weight 188 lbs 3 oz    BMI 31.32    Systolic 118 123 860  Diastolic 78 83 96  Pulse 81 60 77    General:  No acute distress,  Alert and oriented, Non-Toxic, Normal speech and affect Skin: Patient has a small lesion approximately 5 mm x 5 mm on the inner portion of the leg.  It is exquisitely tender to palpation and the pain extends approximately 5 mm on either side beyond the lesion. Mammogram: Not applicable Assessment/Plan Angioleiomyoma: Patient has an exquisitely tender lesion on the inner portion of the left  leg.  The appropriate treatment would be surgical excision.  I discussed this with her at length.  She is agreeable to having this excised.  I believe that due to the location and the possibility of bleeding that it would be preferable to do this in the operating room.  I showed her what I believe we  believe that the scar.  She understands that there is the possibility that this may not close primarily and that she may require dressing changes or placement of a tissue matrix.  She is agreeable to this.  All questions were answered to her satisfaction.  Photographs were obtained today with her consent.  Will schedule her for excision of the lesion in the operating room.  With primary closure and possible placement of tissue matrix.  [name] 12/21/2023, 4:51 PM

## 2023-12-27 ENCOUNTER — Institutional Professional Consult (permissible substitution): Admitting: Plastic Surgery

## 2023-12-28 ENCOUNTER — Institutional Professional Consult (permissible substitution): Admitting: Plastic Surgery

## 2024-01-08 LAB — COLOGUARD: COLOGUARD: NEGATIVE

## 2024-02-15 ENCOUNTER — Ambulatory Visit (INDEPENDENT_AMBULATORY_CARE_PROVIDER_SITE_OTHER): Admitting: Physician Assistant

## 2024-02-15 VITALS — BP 117/80 | HR 67 | Wt 189.0 lb

## 2024-02-15 DIAGNOSIS — D2122 Benign neoplasm of connective and other soft tissue of left lower limb, including hip: Secondary | ICD-10-CM

## 2024-02-15 DIAGNOSIS — D219 Benign neoplasm of connective and other soft tissue, unspecified: Secondary | ICD-10-CM

## 2024-02-15 MED ORDER — OXYCODONE HCL 5 MG PO TABS: 5.0000 mg | ORAL_TABLET | Freq: Three times a day (TID) | ORAL | 0 refills | Status: AC | PRN

## 2024-02-15 MED ORDER — ONDANSETRON 4 MG PO TBDP: 4.0000 mg | ORAL_TABLET | Freq: Three times a day (TID) | ORAL | 0 refills | Status: AC | PRN

## 2024-02-15 NOTE — Progress Notes (Signed)
 Patient ID: [name], female    DOB: 08-02-1980, 43 y.o.   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  female  with a history of angioleiomyoma.  She presents for preoperative evaluation for upcoming procedure, wide excision left lower leg lesion with possible placement of myriad, scheduled for 02/26/2024 with Dr. Waddell.  The patient has not had problems with anesthesia aside from PONV.  Will discuss with anesthesia on the day of surgery.  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  She denies any significant cardiac or pulmonary disease, cancer, personal or family history of blood clots or clotting disorder, varicosities, or nicotine use.  She states that she has significant neuropathy of bilateral lower extremities, etiology unclear.  Reports that she had osteomyelitis involving right big toe last year that has resolved.  Discussed upcoming surgery with patient.  Unclear if it will be able to be primarily closed or if she will have a wound after the wide excision.  Discussed postoperative expectations for each avenue.  She is understanding and agreeable.  Summary of Previous Visit: Patient was noted to have a small lesion approximately 5 x 5 mm on inner portion of left leg that was tender.  She complained of discomfort.  Evidently there was a previous biopsy that showed angioleiomyoma.  Plan is for wide excision in the OR with possible placement of tissue matrix.  Discussed risks and patient was agreeable.  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  PMH Significant for: Angioleiomyoma, GERD, HTN, migraine disorder, anxiety, insomnia, lower extremity neuropathy, right great toe osteomyelitis, appendectomy.   Past Medical History: Allergies: Allergies   Allergen Reactions   Amoxicillin-Pot Clavulanate Anaphylaxis and Hives   Vigamox [Moxifloxacin Hydrochloride] Swelling    Eyes swell   Cephalexin  Nausea And Vomiting    Other reaction(s): Abdominal Pain, Vomiting   Clindamycin  Nausea And Vomiting   Latex Hives   Ativan [Lorazepam]    Dilaudid [Hydromorphone Hcl] Hives    Current Medications:  Current Outpatient Medications:    amLODipine (NORVASC) 10 MG tablet, Take 10 mg by mouth daily., Disp: , Rfl:    BELSOMRA 15 MG TABS, Take 1 tablet by mouth at bedtime as needed., Disp: , Rfl:    brexpiprazole (REXULTI) 1 MG TABS tablet, Take by mouth daily., Disp: , Rfl:    buPROPion (WELLBUTRIN SR) 150 MG 12 hr tablet, Take 150 mg by mouth 2 (two) times daily., Disp: , Rfl:    busPIRone (BUSPAR) 15 MG tablet, Take 15 mg by mouth 2 (two) times daily., Disp: , Rfl:    guanFACINE (INTUNIV) 2 MG TB24 ER tablet, Take 2 mg by mouth 2 (two) times daily., Disp: , Rfl:    lamoTRIgine (LAMICTAL) 200 MG tablet, Take 200 mg by mouth 2 (two) times daily. , Disp: , Rfl:    losartan (COZAAR) 50 MG tablet, Take 50 mg by mouth daily., Disp: , Rfl:    metoprolol tartrate (LOPRESSOR) 25 MG tablet, Take 25 mg by mouth 2 (two) times daily., Disp: , Rfl:    ondansetron  (ZOFRAN -ODT) 4 MG disintegrating tablet, Take 1 tablet (4 mg total) by mouth every 8 (eight) hours as needed for nausea or  vomiting., Disp: 20 tablet, Rfl: 0   oxyCODONE  (ROXICODONE ) 5 MG immediate release tablet, Take 1 tablet (5 mg total) by mouth every 8 (eight) hours as needed for up to 5 days for severe pain (pain score 7-10)., Disp: 8 tablet, Rfl: 0   rizatriptan (MAXALT) 10 MG tablet, Take one tablet (10 mg dose) by mouth once as needed for Migraine for up to 1 dose. May repeat in 2 hours if needed, Disp: , Rfl:    topiramate (TOPAMAX) 50 MG tablet, Take 75 mg by mouth daily., Disp: , Rfl:   Past Medical Problems: Past Medical History:  Diagnosis Date   Depression    Diverticulitis     Fibromyalgia    Hernia, umbilical    Nerve damage    Neuropathy    PTSD (post-traumatic stress disorder)    RA (rheumatoid arthritis) (HCC)    Seizures (HCC)    x 1     Past Surgical History: Past Surgical History:  Procedure Laterality Date   APPENDECTOMY     CESAREAN SECTION     CHOLECYSTECTOMY     COLON SURGERY     FOOT TENDON SURGERY     NERVE BIOPSY Bilateral    lower leg   SACRAL NERVE STIMULATOR PLACEMENT     TONSILLECTOMY      Social History: Social History   Socioeconomic History   Marital status: Single    Spouse name: Not on file   Number of children: Not on file   Years of education: Not on file   Highest education level: High school graduate  Occupational History   Not on file  Tobacco Use   Smoking status: Never   Smokeless tobacco: Never  Vaping Use   Vaping status: Never Used  Substance and Sexual Activity   Alcohol use: Not Currently    Comment: social   Drug use: Yes    Types: Marijuana    Comment: daily at bedtime   Sexual activity: Yes    Birth control/protection: None  Other Topics Concern   Not on file  Social History Narrative   Right handed    Drinks coffee daily 1-2 cups   Wear glasses and contacts    Social Drivers of Health   Financial Resource Strain: Low Risk  (12/22/2023)   Received from [facility]   Overall Financial Resource Strain (CARDIA)    How hard is it for you to pay for the very basics like food, housing, medical care, and heating?: Not very hard  Food Insecurity: Food Insecurity Present (12/22/2023)   Received from [facility]   Hunger Vital Sign    Within the past 12 months, you worried that your food would run out before you got the money to buy more.: Sometimes true    Within the past 12 months, the food you bought just didn't last and you didn't have money to get more.: Never true  Transportation Needs: No Transportation Needs (12/22/2023)   Received from [facility] - Transportation    In the  past 12 months, has lack of transportation kept you from medical appointments or from getting medications?: No    In the past 12 months, has lack of transportation kept you from meetings, work, or from getting things needed for daily living?: No  Physical Activity: Insufficiently Active (12/22/2023)   Received from [facility]   Exercise Vital Sign    On average, how many days per week do you engage in moderate to  strenuous exercise (like a brisk walk)?: 1 day    On average, how many minutes do you engage in exercise at this level?: 10 min  Stress: Stress Concern Present (12/22/2023)   Received from [facility] - Occupational Stress Questionnaire    Do you feel stress - tense, restless, nervous, or anxious, or unable to sleep at night because your mind is troubled all the time - these days?: Very much  Social Connections: Somewhat Isolated (12/22/2023)   Received from [facility]   Social Network    How would you rate your social network (family, work, friends)?: Restricted participation with some degree of social isolation  Intimate Partner Violence: Not At Risk (12/22/2023)   Received from [facility]   HITS    Over the last 12 months how often did your partner physically hurt you?: Never    Over the last 12 months how often did your partner insult you or talk down to you?: Never    Over the last 12 months how often did your partner threaten you with physical harm?: Never    Over the last 12 months how often did your partner scream or curse at you?: Never    Family History: Family History  Problem Relation Age of Onset   Healthy Mother    Healthy Father    Healthy Sister    Healthy Sister     Review of Systems: ROS Denies any recent chest pain, difficulty breathing, leg swelling, fevers.  Physical Exam: Vital Signs BP 117/80 (BP Location: Left Arm, Patient Position: Sitting, Cuff Size: Normal)   Pulse 67   Wt 189 lb (85.7 kg)    LMP 09/14/2018   SpO2 100%   BMI 31.45 kg/m   Physical Exam Constitutional:      General: Not in acute distress.    Appearance: Normal appearance. Not ill-appearing.  HENT:     Head: Normocephalic and atraumatic.  Eyes:     Pupils: Pupils are equal, round. Cardiovascular:     Rate and Rhythm: Normal rate.    Pulses: Normal pulses.  Pulmonary:     Effort: No respiratory distress or increased work of breathing.  Speaks in full sentences. Abdominal:     General: Abdomen is flat. No distension.   Musculoskeletal: Normal range of motion. No lower extremity swelling or edema. No varicosities. Skin:    General: Skin is warm and dry.     Findings: No erythema or rash.  Neurological:     Mental Status: Alert and oriented to person, place, and time.  Psychiatric:        Mood and Affect: Mood normal.        Behavior: Behavior normal.    Assessment/Plan: The patient is scheduled for wide excision left leg lesion with possible placement of tissue matrix with Dr. Waddell.  Risks, benefits, and alternatives of procedure discussed, questions answered and consent obtained.    Smoking Status: Non-smoker.  Caprini Score: 4; Risk Factors include: Age, BMI greater than 25, and length of planned surgery. Recommendation for mechanical prophylaxis. Encourage early ambulation.   Pictures obtained: 12/20/2023  Post-op Rx sent to pharmacy: Oxycodone  and Zofran .  Patient to take Tylenol  and ibuprofen  for primary pain control.  Oxycodone  only for breakthrough pain.  She endorses significant pain after her biopsy last year.  She understands that the excision of this lesion may not resolve the ongoing pain that she describes at this location.  She also understands  not to combine oxycodone  with any of her sedating medications at home.  Patient was provided with the General Surgical Risk consent document and Pain Medication Agreement prior to their appointment.  They had adequate time to read through the risk  consent documents and Pain Medication Agreement. We also discussed them in person together during this preop appointment. All of their questions were answered to their satisfaction.  Recommended calling if they have any further questions.  Risk consent form and Pain Medication Agreement to be scanned into patient's chart.    Electronically signed by: Honora Seip, PA-C 02/15/2024 10:45 AM

## 2024-02-15 NOTE — H&P (View-Only) (Signed)
 Patient ID: [name], female    DOB: 08-02-1980, 43 y.o.   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  female  with a history of angioleiomyoma.  She presents for preoperative evaluation for upcoming procedure, wide excision left lower leg lesion with possible placement of myriad, scheduled for 02/26/2024 with Dr. Waddell.  The patient has not had problems with anesthesia aside from PONV.  Will discuss with anesthesia on the day of surgery.  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  She denies any significant cardiac or pulmonary disease, cancer, personal or family history of blood clots or clotting disorder, varicosities, or nicotine use.  She states that she has significant neuropathy of bilateral lower extremities, etiology unclear.  Reports that she had osteomyelitis involving right big toe last year that has resolved.  Discussed upcoming surgery with patient.  Unclear if it will be able to be primarily closed or if she will have a wound after the wide excision.  Discussed postoperative expectations for each avenue.  She is understanding and agreeable.  Summary of Previous Visit: Patient was noted to have a small lesion approximately 5 x 5 mm on inner portion of left leg that was tender.  She complained of discomfort.  Evidently there was a previous biopsy that showed angioleiomyoma.  Plan is for wide excision in the OR with possible placement of tissue matrix.  Discussed risks and patient was agreeable.  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  PMH Significant for: Angioleiomyoma, GERD, HTN, migraine disorder, anxiety, insomnia, lower extremity neuropathy, right great toe osteomyelitis, appendectomy.   Past Medical History: Allergies: Allergies   Allergen Reactions   Amoxicillin-Pot Clavulanate Anaphylaxis and Hives   Vigamox [Moxifloxacin Hydrochloride] Swelling    Eyes swell   Cephalexin  Nausea And Vomiting    Other reaction(s): Abdominal Pain, Vomiting   Clindamycin  Nausea And Vomiting   Latex Hives   Ativan [Lorazepam]    Dilaudid [Hydromorphone Hcl] Hives    Current Medications:  Current Outpatient Medications:    amLODipine (NORVASC) 10 MG tablet, Take 10 mg by mouth daily., Disp: , Rfl:    BELSOMRA 15 MG TABS, Take 1 tablet by mouth at bedtime as needed., Disp: , Rfl:    brexpiprazole (REXULTI) 1 MG TABS tablet, Take by mouth daily., Disp: , Rfl:    buPROPion (WELLBUTRIN SR) 150 MG 12 hr tablet, Take 150 mg by mouth 2 (two) times daily., Disp: , Rfl:    busPIRone (BUSPAR) 15 MG tablet, Take 15 mg by mouth 2 (two) times daily., Disp: , Rfl:    guanFACINE (INTUNIV) 2 MG TB24 ER tablet, Take 2 mg by mouth 2 (two) times daily., Disp: , Rfl:    lamoTRIgine (LAMICTAL) 200 MG tablet, Take 200 mg by mouth 2 (two) times daily. , Disp: , Rfl:    losartan (COZAAR) 50 MG tablet, Take 50 mg by mouth daily., Disp: , Rfl:    metoprolol tartrate (LOPRESSOR) 25 MG tablet, Take 25 mg by mouth 2 (two) times daily., Disp: , Rfl:    ondansetron  (ZOFRAN -ODT) 4 MG disintegrating tablet, Take 1 tablet (4 mg total) by mouth every 8 (eight) hours as needed for nausea or  vomiting., Disp: 20 tablet, Rfl: 0   oxyCODONE  (ROXICODONE ) 5 MG immediate release tablet, Take 1 tablet (5 mg total) by mouth every 8 (eight) hours as needed for up to 5 days for severe pain (pain score 7-10)., Disp: 8 tablet, Rfl: 0   rizatriptan (MAXALT) 10 MG tablet, Take one tablet (10 mg dose) by mouth once as needed for Migraine for up to 1 dose. May repeat in 2 hours if needed, Disp: , Rfl:    topiramate (TOPAMAX) 50 MG tablet, Take 75 mg by mouth daily., Disp: , Rfl:   Past Medical Problems: Past Medical History:  Diagnosis Date   Depression    Diverticulitis     Fibromyalgia    Hernia, umbilical    Nerve damage    Neuropathy    PTSD (post-traumatic stress disorder)    RA (rheumatoid arthritis) (HCC)    Seizures (HCC)    x 1     Past Surgical History: Past Surgical History:  Procedure Laterality Date   APPENDECTOMY     CESAREAN SECTION     CHOLECYSTECTOMY     COLON SURGERY     FOOT TENDON SURGERY     NERVE BIOPSY Bilateral    lower leg   SACRAL NERVE STIMULATOR PLACEMENT     TONSILLECTOMY      Social History: Social History   Socioeconomic History   Marital status: Single    Spouse name: Not on file   Number of children: Not on file   Years of education: Not on file   Highest education level: High school graduate  Occupational History   Not on file  Tobacco Use   Smoking status: Never   Smokeless tobacco: Never  Vaping Use   Vaping status: Never Used  Substance and Sexual Activity   Alcohol use: Not Currently    Comment: social   Drug use: Yes    Types: Marijuana    Comment: daily at bedtime   Sexual activity: Yes    Birth control/protection: None  Other Topics Concern   Not on file  Social History Narrative   Right handed    Drinks coffee daily 1-2 cups   Wear glasses and contacts    Social Drivers of Health   Financial Resource Strain: Low Risk  (12/22/2023)   Received from [facility]   Overall Financial Resource Strain (CARDIA)    How hard is it for you to pay for the very basics like food, housing, medical care, and heating?: Not very hard  Food Insecurity: Food Insecurity Present (12/22/2023)   Received from [facility]   Hunger Vital Sign    Within the past 12 months, you worried that your food would run out before you got the money to buy more.: Sometimes true    Within the past 12 months, the food you bought just didn't last and you didn't have money to get more.: Never true  Transportation Needs: No Transportation Needs (12/22/2023)   Received from [facility] - Transportation    In the  past 12 months, has lack of transportation kept you from medical appointments or from getting medications?: No    In the past 12 months, has lack of transportation kept you from meetings, work, or from getting things needed for daily living?: No  Physical Activity: Insufficiently Active (12/22/2023)   Received from [facility]   Exercise Vital Sign    On average, how many days per week do you engage in moderate to  strenuous exercise (like a brisk walk)?: 1 day    On average, how many minutes do you engage in exercise at this level?: 10 min  Stress: Stress Concern Present (12/22/2023)   Received from [facility] - Occupational Stress Questionnaire    Do you feel stress - tense, restless, nervous, or anxious, or unable to sleep at night because your mind is troubled all the time - these days?: Very much  Social Connections: Somewhat Isolated (12/22/2023)   Received from [facility]   Social Network    How would you rate your social network (family, work, friends)?: Restricted participation with some degree of social isolation  Intimate Partner Violence: Not At Risk (12/22/2023)   Received from [facility]   HITS    Over the last 12 months how often did your partner physically hurt you?: Never    Over the last 12 months how often did your partner insult you or talk down to you?: Never    Over the last 12 months how often did your partner threaten you with physical harm?: Never    Over the last 12 months how often did your partner scream or curse at you?: Never    Family History: Family History  Problem Relation Age of Onset   Healthy Mother    Healthy Father    Healthy Sister    Healthy Sister     Review of Systems: ROS Denies any recent chest pain, difficulty breathing, leg swelling, fevers.  Physical Exam: Vital Signs BP 117/80 (BP Location: Left Arm, Patient Position: Sitting, Cuff Size: Normal)   Pulse 67   Wt 189 lb (85.7 kg)    LMP 09/14/2018   SpO2 100%   BMI 31.45 kg/m   Physical Exam Constitutional:      General: Not in acute distress.    Appearance: Normal appearance. Not ill-appearing.  HENT:     Head: Normocephalic and atraumatic.  Eyes:     Pupils: Pupils are equal, round. Cardiovascular:     Rate and Rhythm: Normal rate.    Pulses: Normal pulses.  Pulmonary:     Effort: No respiratory distress or increased work of breathing.  Speaks in full sentences. Abdominal:     General: Abdomen is flat. No distension.   Musculoskeletal: Normal range of motion. No lower extremity swelling or edema. No varicosities. Skin:    General: Skin is warm and dry.     Findings: No erythema or rash.  Neurological:     Mental Status: Alert and oriented to person, place, and time.  Psychiatric:        Mood and Affect: Mood normal.        Behavior: Behavior normal.    Assessment/Plan: The patient is scheduled for wide excision left leg lesion with possible placement of tissue matrix with Dr. Waddell.  Risks, benefits, and alternatives of procedure discussed, questions answered and consent obtained.    Smoking Status: Non-smoker.  Caprini Score: 4; Risk Factors include: Age, BMI greater than 25, and length of planned surgery. Recommendation for mechanical prophylaxis. Encourage early ambulation.   Pictures obtained: 12/20/2023  Post-op Rx sent to pharmacy: Oxycodone  and Zofran .  Patient to take Tylenol  and ibuprofen  for primary pain control.  Oxycodone  only for breakthrough pain.  She endorses significant pain after her biopsy last year.  She understands that the excision of this lesion may not resolve the ongoing pain that she describes at this location.  She also understands  not to combine oxycodone  with any of her sedating medications at home.  Patient was provided with the General Surgical Risk consent document and Pain Medication Agreement prior to their appointment.  They had adequate time to read through the risk  consent documents and Pain Medication Agreement. We also discussed them in person together during this preop appointment. All of their questions were answered to their satisfaction.  Recommended calling if they have any further questions.  Risk consent form and Pain Medication Agreement to be scanned into patient's chart.    Electronically signed by: Honora Seip, PA-C 02/15/2024 10:45 AM

## 2024-02-18 ENCOUNTER — Other Ambulatory Visit: Payer: Self-pay | Admitting: Medical Genetics

## 2024-02-18 DIAGNOSIS — Z006 Encounter for examination for normal comparison and control in clinical research program: Secondary | ICD-10-CM

## 2024-02-19 ENCOUNTER — Encounter (HOSPITAL_BASED_OUTPATIENT_CLINIC_OR_DEPARTMENT_OTHER): Payer: Self-pay | Admitting: Plastic Surgery

## 2024-02-19 ENCOUNTER — Other Ambulatory Visit: Payer: Self-pay

## 2024-02-19 NOTE — Progress Notes (Signed)
   02/19/24 1111  Pre-op Phone Call  Surgery Date Verified 02/26/24  Arrival Time Verified 1130  Surgery Location Verified [facility]  Medical History Reviewed Yes  Is the patient taking a GLP-1 receptor agonist? No  Does the patient have diabetes? No diagnosis of diabetes  Do you have a history of heart problems? No  Antiarrhythmic device type  (n/a)  Does patient have other implanted devices? Yes  Implanted Devices: (S)  Spinal cord stimulator  Does the patient have the ability to turn device off? (S)  Yes (pt states that it has been off for a year)  Patient has been instructed to bring device remote, etc. on day of procedure. (S)  Yes  Patient Teaching Enhanced Recovery;Pre / Post Procedure;Pre-op CHG Bathing  Patient educated about smoking cessation 24 hours prior to surgery. (S)  Yes  Patient verbalizes understanding of bowel prep? N/A  Med Rec Completed Yes  Take the Following Meds the Morning of Surgery metoprolol buspar wellbutrin amlodipine and lamictal  Recent  Lab Work, EKG, CXR? No  NPO (Including gum & candy) After midnight  Allowed clear liquids Water  Patient instructed to stop clear liquids including Carb loading drink at: 0900  Stop Solids, Milk, Candy, and Gum STARTING AT MIDNIGHT  Responsible adult to drive and be with you for 24 hours? Yes  Name & Phone Number for Ride/Caregiver mom  No Jewelry, money, nail polish or make-up.  No lotions, powders, perfumes. No shaving  48 hrs. prior to surgery. Yes  Contacts, Dentures & Glasses Will Have to be Removed Before OR. Yes  Please bring your ID and Insurance Card the morning of your surgery. (Surgery Centers Only) Yes  Bring any papers or x-rays with you that your surgeon gave you. Yes  Instructed to contact the location of procedure/ provider if they or anyone in their household develops symptoms or tests positive for COVID-19, has close contact with someone who tests positive for COVID, or has known exposure to any  contagious illness. Yes  Call this number the morning of surgery  with any problems that may cancel your surgery. [phone]

## 2024-02-21 NOTE — Anesthesia Preprocedure Evaluation (Addendum)
 Anesthesia Evaluation  Patient identified by MRN, date of birth, ID band Patient awake    Reviewed: Allergy & Precautions, NPO status , Patient's Chart, lab work & pertinent test results, reviewed documented beta blocker date and time   History of Anesthesia Complications (+) PROLONGED EMERGENCE and history of anesthetic complications (did well w/ last surgery 2023 under LMA)  Airway Mallampati: II  TM Distance: >3 FB Neck ROM: Full    Dental  (+) Teeth Intact, Dental Advisory Given   Pulmonary sleep apnea (unable to tolerate cpap)    Pulmonary exam normal breath sounds clear to auscultation       Cardiovascular hypertension, Pt. on medications and Pt. on home beta blockers Normal cardiovascular exam Rhythm:Regular Rate:Normal     Neuro/Psych Seizures - (2011), Well Controlled,  PSYCHIATRIC DISORDERS Anxiety Depression       GI/Hepatic negative GI ROS,,,(+)       marijuana use  Endo/Other  BMI 31  Renal/GU negative Renal ROS  negative genitourinary   Musculoskeletal  (+) Arthritis , Rheumatoid disorders,  Fibromyalgia -  Abdominal  (+) + obese  Peds  Hematology negative hematology ROS (+)   Anesthesia Other Findings   Reproductive/Obstetrics negative OB ROS                              Anesthesia Physical Anesthesia Plan  ASA: 3  Anesthesia Plan: General   Post-op Pain Management: Tylenol  PO (pre-op)* and Toradol  IV (intra-op)*   Induction: Intravenous  PONV Risk Score and Plan: 3 and Ondansetron , Dexamethasone , Midazolam and Treatment may vary due to age or medical condition  Airway Management Planned: LMA  Additional Equipment: None  Intra-op Plan:   Post-operative Plan: Extubation in OR  Informed Consent: I have reviewed the patients History and Physical, chart, labs and discussed the procedure including the risks, benefits and alternatives for the proposed anesthesia  with the patient or authorized representative who has indicated his/her understanding and acceptance.     Dental advisory given  Plan Discussed with: CRNA  Anesthesia Plan Comments:          Anesthesia Quick Evaluation

## 2024-02-22 ENCOUNTER — Encounter (HOSPITAL_BASED_OUTPATIENT_CLINIC_OR_DEPARTMENT_OTHER)
Admission: RE | Admit: 2024-02-22 | Discharge: 2024-02-22 | Disposition: A | Discharge status: Home / Self Care | Source: Ambulatory Visit | Attending: Plastic Surgery | Admitting: Plastic Surgery

## 2024-02-22 DIAGNOSIS — Z01818 Encounter for other preprocedural examination: Secondary | ICD-10-CM | POA: Insufficient documentation

## 2024-02-22 MED ORDER — CHLORHEXIDINE GLUCONATE CLOTH 2 % EX PADS: 6.0000 | MEDICATED_PAD | Freq: Once | CUTANEOUS | Status: DC

## 2024-02-22 NOTE — Progress Notes (Signed)

## 2024-02-26 ENCOUNTER — Ambulatory Visit (HOSPITAL_BASED_OUTPATIENT_CLINIC_OR_DEPARTMENT_OTHER): Payer: Self-pay | Admitting: Anesthesiology

## 2024-02-26 ENCOUNTER — Ambulatory Visit (HOSPITAL_BASED_OUTPATIENT_CLINIC_OR_DEPARTMENT_OTHER)
Admission: RE | Admit: 2024-02-26 | Discharge: 2024-02-26 | Disposition: A | Discharge status: Home / Self Care | Attending: Plastic Surgery | Admitting: Plastic Surgery

## 2024-02-26 ENCOUNTER — Encounter (HOSPITAL_BASED_OUTPATIENT_CLINIC_OR_DEPARTMENT_OTHER)
Admission: RE | Disposition: A | Discharge status: Home / Self Care | Payer: Self-pay | Source: Home / Self Care | Attending: Plastic Surgery

## 2024-02-26 ENCOUNTER — Other Ambulatory Visit: Payer: Self-pay

## 2024-02-26 ENCOUNTER — Encounter (HOSPITAL_BASED_OUTPATIENT_CLINIC_OR_DEPARTMENT_OTHER): Payer: Self-pay | Admitting: Plastic Surgery

## 2024-02-26 DIAGNOSIS — I10 Essential (primary) hypertension: Secondary | ICD-10-CM

## 2024-02-26 DIAGNOSIS — L989 Disorder of the skin and subcutaneous tissue, unspecified: Secondary | ICD-10-CM

## 2024-02-26 DIAGNOSIS — D2122 Benign neoplasm of connective and other soft tissue of left lower limb, including hip: Secondary | ICD-10-CM

## 2024-02-26 DIAGNOSIS — Z01818 Encounter for other preprocedural examination: Secondary | ICD-10-CM

## 2024-02-26 HISTORY — DX: Essential (primary) hypertension: I10

## 2024-02-26 HISTORY — DX: Sleep apnea, unspecified: G47.30

## 2024-02-26 HISTORY — DX: Other complications of anesthesia, initial encounter: T88.59XA

## 2024-02-26 HISTORY — PX: LESION REMOVAL: SHX5196

## 2024-02-26 SURGERY — EXCISION, LESION, LOWER EXTREMITY
Anesthesia: General | Site: Leg Lower | Laterality: Left

## 2024-02-26 MED ORDER — PROPOFOL 10 MG/ML IV BOLUS
INTRAVENOUS | Status: DC | PRN
  Administered 2024-02-26: 100 mg via INTRAVENOUS
  Administered 2024-02-26: 200 mg via INTRAVENOUS
  Administered 2024-02-26: 50 mg via INTRAVENOUS

## 2024-02-26 MED ORDER — DEXAMETHASONE SODIUM PHOSPHATE 4 MG/ML IJ SOLN
INTRAMUSCULAR | Status: DC | PRN
  Administered 2024-02-26: 10 mg via INTRAVENOUS

## 2024-02-26 MED ORDER — SCOPOLAMINE 1 MG/3DAYS TD PT72
MEDICATED_PATCH | TRANSDERMAL | Status: AC
Start: 2024-02-26 — End: 2024-02-26
  Filled 2024-02-26: qty 1

## 2024-02-26 MED ORDER — FENTANYL CITRATE (PF) 100 MCG/2ML IJ SOLN
INTRAMUSCULAR | Status: AC
  Filled 2024-02-26: qty 2

## 2024-02-26 MED ORDER — MIDAZOLAM HCL 2 MG/2ML IJ SOLN
INTRAMUSCULAR | Status: AC
  Filled 2024-02-26: qty 2

## 2024-02-26 MED ORDER — ONDANSETRON HCL 4 MG/2ML IJ SOLN
INTRAMUSCULAR | Status: AC
  Filled 2024-02-26: qty 2

## 2024-02-26 MED ORDER — PROPOFOL 10 MG/ML IV BOLUS
INTRAVENOUS | Status: AC
  Filled 2024-02-26: qty 20

## 2024-02-26 MED ORDER — ONDANSETRON HCL 4 MG/2ML IJ SOLN: 4.0000 mg | Freq: Once | INTRAMUSCULAR | Status: DC | PRN

## 2024-02-26 MED ORDER — VANCOMYCIN HCL IN DEXTROSE 1-5 GM/200ML-% IV SOLN
INTRAVENOUS | Status: AC
  Filled 2024-02-26: qty 200

## 2024-02-26 MED ORDER — SCOPOLAMINE 1 MG/3DAYS TD PT72
1.0000 | MEDICATED_PATCH | TRANSDERMAL | Status: DC
  Administered 2024-02-26: 1 mg via TRANSDERMAL

## 2024-02-26 MED ORDER — LIDOCAINE 2% (20 MG/ML) 5 ML SYRINGE
INTRAMUSCULAR | Status: DC | PRN
  Administered 2024-02-26: 60 mg via INTRAVENOUS

## 2024-02-26 MED ORDER — OXYCODONE HCL 5 MG PO TABS: 5.0000 mg | ORAL_TABLET | Freq: Once | ORAL | Status: DC | PRN

## 2024-02-26 MED ORDER — EPHEDRINE 5 MG/ML INJ
INTRAVENOUS | Status: AC
  Filled 2024-02-26: qty 5

## 2024-02-26 MED ORDER — PHENYLEPHRINE 80 MCG/ML (10ML) SYRINGE FOR IV PUSH (FOR BLOOD PRESSURE SUPPORT)
PREFILLED_SYRINGE | INTRAVENOUS | Status: AC
  Filled 2024-02-26: qty 10

## 2024-02-26 MED ORDER — 0.9 % SODIUM CHLORIDE (POUR BTL) OPTIME
TOPICAL | Status: DC | PRN
  Administered 2024-02-26: 1000 mL

## 2024-02-26 MED ORDER — ACETAMINOPHEN 500 MG PO TABS
ORAL_TABLET | ORAL | Status: AC
  Filled 2024-02-26: qty 2

## 2024-02-26 MED ORDER — OXYCODONE HCL 5 MG/5ML PO SOLN: 5.0000 mg | Freq: Once | ORAL | Status: DC | PRN

## 2024-02-26 MED ORDER — AMISULPRIDE (ANTIEMETIC) 5 MG/2ML IV SOLN
10.0000 mg | Freq: Once | INTRAVENOUS | Status: DC | PRN
Start: 2024-02-26 — End: 2024-02-26

## 2024-02-26 MED ORDER — BUPIVACAINE-EPINEPHRINE 0.25% -1:200000 IJ SOLN
INTRAMUSCULAR | Status: DC | PRN
Start: 2024-02-26 — End: 2024-02-26
  Administered 2024-02-26: 15 mL

## 2024-02-26 MED ORDER — ONDANSETRON HCL 4 MG/2ML IJ SOLN
INTRAMUSCULAR | Status: DC | PRN
  Administered 2024-02-26: 4 mg via INTRAVENOUS

## 2024-02-26 MED ORDER — LACTATED RINGERS IV SOLN: INTRAVENOUS | Status: DC

## 2024-02-26 MED ORDER — VANCOMYCIN HCL IN DEXTROSE 1-5 GM/200ML-% IV SOLN
1000.0000 mg | INTRAVENOUS | Status: AC
  Administered 2024-02-26: 1000 mg via INTRAVENOUS

## 2024-02-26 MED ORDER — FENTANYL CITRATE (PF) 100 MCG/2ML IJ SOLN
INTRAMUSCULAR | Status: DC | PRN
  Administered 2024-02-26: 50 ug via INTRAVENOUS

## 2024-02-26 MED ORDER — ACETAMINOPHEN 500 MG PO TABS
1000.0000 mg | ORAL_TABLET | Freq: Once | ORAL | Status: AC
  Administered 2024-02-26: 1000 mg via ORAL

## 2024-02-26 MED ORDER — FENTANYL CITRATE (PF) 100 MCG/2ML IJ SOLN: 25.0000 ug | INTRAMUSCULAR | Status: DC | PRN

## 2024-02-26 MED ORDER — CEFAZOLIN SODIUM-DEXTROSE 2-3 GM-%(50ML) IV SOLR: INTRAVENOUS | Status: DC | PRN

## 2024-02-26 MED ORDER — DEXMEDETOMIDINE HCL IN NACL 80 MCG/20ML IV SOLN
INTRAVENOUS | Status: DC | PRN
  Administered 2024-02-26: 4 ug via INTRAVENOUS

## 2024-02-26 MED ORDER — EPHEDRINE SULFATE (PRESSORS) 25 MG/5ML IV SOSY
PREFILLED_SYRINGE | INTRAVENOUS | Status: DC | PRN
  Administered 2024-02-26: 5 mg via INTRAVENOUS
  Administered 2024-02-26: 10 mg via INTRAVENOUS

## 2024-02-26 MED ORDER — MIDAZOLAM HCL (PF) 2 MG/2ML IJ SOLN
2.0000 mg | Freq: Once | INTRAMUSCULAR | Status: AC
  Administered 2024-02-26: 2 mg via INTRAVENOUS

## 2024-02-26 MED ORDER — PHENYLEPHRINE HCL (PRESSORS) 10 MG/ML IV SOLN
INTRAVENOUS | Status: DC | PRN
  Administered 2024-02-26: 160 ug via INTRAVENOUS
  Administered 2024-02-26: 200 ug via INTRAVENOUS
  Administered 2024-02-26 (×2): 100 ug via INTRAVENOUS

## 2024-02-26 SURGICAL SUPPLY — 59 items
BLADE CLIPPER SURG (BLADE) IMPLANT
BLADE SURG 15 STRL LF DISP TIS (BLADE) ×2 IMPLANT
BNDG ELASTIC 2INX 5YD STR LF (GAUZE/BANDAGES/DRESSINGS) IMPLANT
CANISTER SUCT 1200ML W/VALVE (MISCELLANEOUS) IMPLANT
CORD BIPOLAR FORCEPS 12FT (ELECTRODE) IMPLANT
COVER BACK TABLE 60X90IN (DRAPES) ×2 IMPLANT
COVER MAYO STAND STRL (DRAPES) ×2 IMPLANT
DERMABOND ADVANCED .7 DNX12 (GAUZE/BANDAGES/DRESSINGS) ×1 IMPLANT
DRAPE LAPAROTOMY 100X72 PEDS (DRAPES) IMPLANT
DRAPE U-SHAPE 76X120 STRL (DRAPES) IMPLANT
DRESSING MEPILEX FLEX 4X4 (GAUZE/BANDAGES/DRESSINGS) ×1 IMPLANT
DRSG MEPILEX POST OP 4X8 (GAUZE/BANDAGES/DRESSINGS) IMPLANT
DRSG TEGADERM 2-3/8X2-3/4 SM (GAUZE/BANDAGES/DRESSINGS) IMPLANT
ELECT NDL BLADE 2-5/6 (NEEDLE) ×1 IMPLANT
ELECT NEEDLE BLADE 2-5/6 (NEEDLE) ×2 IMPLANT
ELECTRODE REM PT RETRN 9FT PED (ELECTROSURGICAL) IMPLANT
ELECTRODE REM PT RTRN 9FT ADLT (ELECTROSURGICAL) ×1 IMPLANT
GAUZE SPONGE 2X2 STRL 8-PLY (GAUZE/BANDAGES/DRESSINGS) IMPLANT
GAUZE SPONGE 4X4 12PLY STRL LF (GAUZE/BANDAGES/DRESSINGS) IMPLANT
GAUZE STRETCH 2X75IN STRL (MISCELLANEOUS) IMPLANT
GAUZE XEROFORM 1X8 LF (GAUZE/BANDAGES/DRESSINGS) IMPLANT
GLOVE BIO SURGEON STRL SZ 6.5 (GLOVE) IMPLANT
GLOVE BIO SURGEON STRL SZ7.5 (GLOVE) IMPLANT
GLOVE BIO SURGEON STRL SZ8 (GLOVE) ×2 IMPLANT
GLOVE BIOGEL PI IND STRL 7.0 (GLOVE) IMPLANT
GLOVE BIOGEL PI IND STRL 8 (GLOVE) ×2 IMPLANT
GLOVE SURG SYN 6.5 PF PI (GLOVE) ×3 IMPLANT
GOWN STRL REUS W/ TWL LRG LVL3 (GOWN DISPOSABLE) ×4 IMPLANT
NDL HYPO 30GX1 BEV (NEEDLE) ×1 IMPLANT
NDL PRECISIONGLIDE 27X1.5 (NEEDLE) IMPLANT
NEEDLE HYPO 30GX1 BEV (NEEDLE) ×2 IMPLANT
NEEDLE PRECISIONGLIDE 27X1.5 (NEEDLE) IMPLANT
PACK BASIN DAY SURGERY FS (CUSTOM PROCEDURE TRAY) ×2 IMPLANT
PENCIL SMOKE EVACUATOR (MISCELLANEOUS) IMPLANT
SHEET MEDIUM DRAPE 40X70 STRL (DRAPES) IMPLANT
SOLN 0.9% NACL POUR BTL 1000ML (IV SOLUTION) IMPLANT
SPIKE FLUID TRANSFER (MISCELLANEOUS) ×1 IMPLANT
STAPLER SKIN PROX WIDE 3.9 (STAPLE) ×1 IMPLANT
STRIP CLOSURE SKIN 1/2X4 (GAUZE/BANDAGES/DRESSINGS) IMPLANT
SUCTION TUBE FRAZIER 10FR DISP (SUCTIONS) IMPLANT
SUT MNCRL 6-0 UNDY P1 1X18 (SUTURE) IMPLANT
SUT MNCRL AB 3-0 PS2 18 (SUTURE) ×2 IMPLANT
SUT MNCRL AB 4-0 PS2 18 (SUTURE) ×1 IMPLANT
SUT MON AB 5-0 P3 18 (SUTURE) IMPLANT
SUT MON AB 5-0 PS2 18 (SUTURE) IMPLANT
SUT PROLENE 4 0 PS 2 18 (SUTURE) IMPLANT
SUT PROLENE 5 0 P 3 (SUTURE) IMPLANT
SUT PROLENE 5 0 PS 2 (SUTURE) IMPLANT
SUT PROLENE 6 0 P 1 18 (SUTURE) IMPLANT
SUT SILK 2 0 SH (SUTURE) ×1 IMPLANT
SUT VIC AB 3-0 SH 27X BRD (SUTURE) IMPLANT
SUT VIC AB 4-0 PS2 18 (SUTURE) IMPLANT
SUT VIC AB 5-0 P-3 18X BRD (SUTURE) IMPLANT
SUT VIC AB 5-0 PS2 18 (SUTURE) IMPLANT
SYR BULB EAR ULCER 3OZ GRN STR (SYRINGE) IMPLANT
SYR CONTROL 10ML LL (SYRINGE) ×2 IMPLANT
TOWEL GREEN STERILE FF (TOWEL DISPOSABLE) ×2 IMPLANT
TRAY DSU PREP LF (CUSTOM PROCEDURE TRAY) ×2 IMPLANT
TUBE CONNECTING 20X1/4 (TUBING) IMPLANT

## 2024-02-26 NOTE — Transfer of Care (Signed)
 Immediate Anesthesia Transfer of Care Note  Patient: [name]  Procedure(s) Performed: EXCISION, LESION, LOWER EXTREMITY (Left: Leg Lower)  Patient Location: PACU  Anesthesia Type:General  Level of Consciousness: awake, alert , and oriented  Airway & Oxygen Therapy: Patient Spontanous Breathing and Patient connected to face mask oxygen  Post-op Assessment: Report given to RN and Post -op Vital signs reviewed and stable  Post vital signs: Reviewed and stable  Last Vitals:  Vitals Value Taken Time  BP 112/74 02/26/24 13:10  Temp    Pulse 69 02/26/24 13:13  Resp 20 02/26/24 13:13  SpO2 100 % 02/26/24 13:13  Vitals shown include unfiled device data.  Last Pain:  Vitals:   02/26/24 1117  TempSrc: Temporal  PainSc: 8       Patients Stated Pain Goal: 3 (02/26/24 1117)  Complications: No notable events documented.

## 2024-02-26 NOTE — Interval H&P Note (Signed)
 History and Physical Interval Note: No change in exam or indication for surgery All questions answered Site marked and procedure reviewed with Ms Lutter Will proceed at her request  02/26/2024 11:43 AM  [name]  has presented today for surgery, with the diagnosis of ANGIOLEIOMYOMA.  The various methods of treatment have been discussed with the patient and family. After consideration of risks, benefits and other options for treatment, the patient has consented to  Procedure(s) with comments: WIDE EXCISION, LESION, UPPER EXTREMITY (N/A) - .Excision of skin lesion, possible myriad placement APPLICATION, SKIN SUBSTITUTE (N/A) as a surgical intervention.  The patient's history has been reviewed, patient examined, no change in status, stable for surgery.  I have reviewed the patient's chart and labs.  Questions were answered to the patient's satisfaction.     [name]

## 2024-02-26 NOTE — Op Note (Addendum)
 DATE OF OPERATION: 02/26/2024  LOCATION: [facility] operating Room  PREOPERATIVE DIAGNOSIS: Left lower extremity skin lesion  POSTOPERATIVE DIAGNOSIS: Same  PROCEDURE: Excision of left lower extremity skin lesion with limited skin undermining and layered closure  SURGEON: Marinell Birmingham, MD  ASSISTANT: Not applicable  EBL: 10 cc  CONDITION: Stable  COMPLICATIONS: None  INDICATION: The patient, [name], is a 43 y.o. female born on 1980/08/02, is here for treatment of a painful skin lesion previously biopsied and diagnosed as an angio leiomyoma.   PROCEDURE DETAILS:  The patient was seen prior to surgery and marked.  The IV antibiotics were given. The patient was taken to the operating room and given a general anesthetic. A standard time out was performed and all information was confirmed by those in the room. SCDs were placed.   The left lower extremity was prepped and draped in usual sterile manner an elliptical incision was made around the lesion and dissection carried out through the skin and subcutaneous tissue.  The lesion stayed above the fascia.  The lesion was marked with a short superior suture and a long lateral suture and sent to pathology for routine examination.  The wound was inspected for bleeding and hemostasis achieved to the electrocautery.  The subcutaneous tissues were infiltrated with quarter percent Marcaine  with epinephrine .  I then undermined the medial and lateral skin flaps for a distance of approximately 1 cm on each side to allow for a tension-free closure.  The deep tissues were closed with interrupted 3-0 Monocryl sutures.  The dermis was closed with interrupted 3-0 Monocryl sutures and the skin was closed with a running 4-0 Monocryl subcuticular stitch. The lesion was approximately 1 cm in diameter. The total length of the incision was 4 cm.  The incision was sealed with Dermabond and a dressing applied.  An Ace wrap was applied over this for gentle  compression.  The patient was awakened from anesthesia and transferred to the recovery room in good condition all instrument needle and sponge counts were reported as correct and no complications were appreciated during the procedure. The patient was allowed to wake up and taken to recovery room in stable condition at the end of the case. The family was notified at the end of the case.

## 2024-02-26 NOTE — Anesthesia Procedure Notes (Signed)
 Procedure Name: LMA Insertion Date/Time: 02/26/2024 12:14 PM  Performed by: Leotha Andrez DEL, CRNAPre-anesthesia Checklist: Patient identified, Emergency Drugs available, Suction available, Patient being monitored and Timeout performed Patient Re-evaluated:Patient Re-evaluated prior to induction Oxygen Delivery Method: Circle system utilized Preoxygenation: Pre-oxygenation with 100% oxygen Induction Type: IV induction Ventilation: Mask ventilation without difficulty LMA: LMA inserted LMA Size: 4.0 Number of attempts: 1 Placement Confirmation: breath sounds checked- equal and bilateral and positive ETCO2 Tube secured with: Tape Dental Injury: Teeth and Oropharynx as per pre-operative assessment

## 2024-02-26 NOTE — Discharge Instructions (Addendum)
 Please leave dressing in place until Wednesday unless it becomes soiled May shower tomorrow. Ace wrap in place for comfort may be removed as desired No driving for 24 hours and while taking narcotic pain medicine Please call the office for any questions or concerns - [phone]  May take Tylenol  after 5:30 pm, if needed.    Post Anesthesia Home Care Instructions  Activity: Get plenty of rest for the remainder of the day. A responsible individual must stay with you for 24 hours following the procedure.  For the next 24 hours, DO NOT: -Drive a car -Advertising copywriter -Drink alcoholic beverages -Take any medication unless instructed by your physician -Make any legal decisions or sign important papers.  Meals: Start with liquid foods such as gelatin or soup. Progress to regular foods as tolerated. Avoid greasy, spicy, heavy foods. If nausea and/or vomiting occur, drink only clear liquids until the nausea and/or vomiting subsides. Call your physician if vomiting continues.  Special Instructions/Symptoms: Your throat may feel dry or sore from the anesthesia or the breathing tube placed in your throat during surgery. If this causes discomfort, gargle with warm salt water. The discomfort should disappear within 24 hours.  If you had a scopolamine patch placed behind your ear for the management of post- operative nausea and/or vomiting:  1. The medication in the patch is effective for 72 hours, after which it should be removed.  Wrap patch in a tissue and discard in the trash. Wash hands thoroughly with soap and water. 2. You may remove the patch earlier than 72 hours if you experience unpleasant side effects which may include dry mouth, dizziness or visual disturbances. 3. Avoid touching the patch. Wash your hands with soap and water after contact with the patch.

## 2024-02-26 NOTE — Anesthesia Postprocedure Evaluation (Signed)
 Anesthesia Post Note  Patient: [name]  Procedure(s) Performed: EXCISION, LESION, LOWER EXTREMITY (Left: Leg Lower)     Patient location during evaluation: PACU Anesthesia Type: General Level of consciousness: awake and alert, oriented and patient cooperative Pain management: pain level controlled Vital Signs Assessment: post-procedure vital signs reviewed and stable Respiratory status: spontaneous breathing, nonlabored ventilation and respiratory function stable Cardiovascular status: blood pressure returned to baseline and stable Postop Assessment: no apparent nausea or vomiting Anesthetic complications: no   No notable events documented.  Last Vitals:  Vitals:   02/26/24 1325 02/26/24 1341  BP: 113/64 115/77  Pulse: 69 72  Resp: 15 16  Temp:  36.7 C  SpO2: 99% 100%    Last Pain:  Vitals:   02/26/24 1341  TempSrc:   PainSc: 0-No pain                 [name]

## 2024-02-27 ENCOUNTER — Encounter (HOSPITAL_BASED_OUTPATIENT_CLINIC_OR_DEPARTMENT_OTHER): Payer: Self-pay | Admitting: Plastic Surgery

## 2024-02-29 LAB — SURGICAL PATHOLOGY

## 2024-03-04 ENCOUNTER — Ambulatory Visit: Admitting: Plastic Surgery

## 2024-03-04 ENCOUNTER — Encounter: Payer: Self-pay | Admitting: Plastic Surgery

## 2024-03-04 VITALS — BP 110/71 | HR 66 | Ht 66.0 in | Wt 191.2 lb

## 2024-03-04 DIAGNOSIS — D219 Benign neoplasm of connective and other soft tissue, unspecified: Secondary | ICD-10-CM

## 2024-03-04 DIAGNOSIS — Z9889 Other specified postprocedural states: Secondary | ICD-10-CM

## 2024-03-04 NOTE — Progress Notes (Signed)
 [name] returns today 1 week postop from excision of a skin lesion on her left leg.  She is doing well and notes that she has had significant improvement in her pain.  On examination the incision is healing nicely.  Pathology was reviewed with her.  Pathology showed a completely excised angioleiomyoma.  We discussed scar massage and slowly returning to activity as tolerated.  Follow-up as needed.

## 2024-03-15 LAB — GENECONNECT MOLECULAR SCREEN: Genetic Analysis Overall Interpretation: NEGATIVE

## 2024-03-18 ENCOUNTER — Encounter: Admitting: Student

## 2024-04-01 ENCOUNTER — Encounter: Admitting: Student

## 2024-04-17 ENCOUNTER — Encounter: Admitting: Physician Assistant
